# Patient Record
Sex: Male | Born: 1985 | Race: White | Hispanic: No | Marital: Married | Smoking: Never smoker
Health system: Southern US, Community
[De-identification: ages and names within clinical notes are randomized; demographics above are authoritative.]

## PROBLEM LIST (undated history)

## (undated) DIAGNOSIS — T7840XA Allergy, unspecified, initial encounter: Secondary | ICD-10-CM

## (undated) DIAGNOSIS — K219 Gastro-esophageal reflux disease without esophagitis: Secondary | ICD-10-CM

## (undated) DIAGNOSIS — F329 Major depressive disorder, single episode, unspecified: Secondary | ICD-10-CM

## (undated) DIAGNOSIS — F419 Anxiety disorder, unspecified: Secondary | ICD-10-CM

## (undated) DIAGNOSIS — F32A Depression, unspecified: Secondary | ICD-10-CM

## (undated) DIAGNOSIS — Z91018 Allergy to other foods: Secondary | ICD-10-CM

## (undated) DIAGNOSIS — I10 Essential (primary) hypertension: Secondary | ICD-10-CM

## (undated) HISTORY — DX: Allergy, unspecified, initial encounter: T78.40XA

## (undated) HISTORY — DX: Depression, unspecified: F32.A

## (undated) HISTORY — DX: Gastro-esophageal reflux disease without esophagitis: K21.9

## (undated) HISTORY — PX: WISDOM TOOTH EXTRACTION: SHX21

## (undated) HISTORY — PX: NO PAST SURGERIES: SHX2092

## (undated) HISTORY — DX: Anxiety disorder, unspecified: F41.9

## (undated) HISTORY — DX: Allergy to other foods: Z91.018

---
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
## 2006-02-28 ENCOUNTER — Encounter: Admission: RE | Admit: 2006-02-28 | Discharge: 2006-02-28 | Payer: Self-pay | Admitting: Occupational Medicine

## 2015-04-10 ENCOUNTER — Emergency Department (HOSPITAL_BASED_OUTPATIENT_CLINIC_OR_DEPARTMENT_OTHER)
Admission: EM | Admit: 2015-04-10 | Discharge: 2015-04-10 | Disposition: A | Discharge status: Home / Self Care | Payer: Commercial Managed Care - PPO | Attending: Emergency Medicine | Admitting: Emergency Medicine

## 2015-04-10 ENCOUNTER — Encounter (HOSPITAL_BASED_OUTPATIENT_CLINIC_OR_DEPARTMENT_OTHER): Payer: Self-pay | Admitting: *Deleted

## 2015-04-10 DIAGNOSIS — F419 Anxiety disorder, unspecified: Secondary | ICD-10-CM | POA: Insufficient documentation

## 2015-04-10 DIAGNOSIS — R200 Anesthesia of skin: Secondary | ICD-10-CM | POA: Diagnosis not present

## 2015-04-10 DIAGNOSIS — R42 Dizziness and giddiness: Secondary | ICD-10-CM | POA: Diagnosis present

## 2015-04-10 LAB — COMPREHENSIVE METABOLIC PANEL
ALBUMIN: 4.7 g/dL (ref 3.5–5.0)
ALT: 20 U/L (ref 17–63)
ANION GAP: 9 (ref 5–15)
AST: 23 U/L (ref 15–41)
Alkaline Phosphatase: 73 U/L (ref 38–126)
BILIRUBIN TOTAL: 0.8 mg/dL (ref 0.3–1.2)
BUN: 21 mg/dL — ABNORMAL HIGH (ref 6–20)
CO2: 27 mmol/L (ref 22–32)
Calcium: 8.9 mg/dL (ref 8.9–10.3)
Chloride: 102 mmol/L (ref 101–111)
Creatinine, Ser: 1.13 mg/dL (ref 0.61–1.24)
GFR calc Af Amer: >60 mL/min (ref >60)
GFR calc non Af Amer: >60 mL/min (ref >60)
GLUCOSE: 114 mg/dL — AB (ref 65–99)
POTASSIUM: 4 mmol/L (ref 3.5–5.1)
SODIUM: 138 mmol/L (ref 135–145)
TOTAL PROTEIN: 8 g/dL (ref 6.5–8.1)

## 2015-04-10 LAB — CBC WITH DIFFERENTIAL/PLATELET
BASOS PCT: 0 %
Basophils Absolute: 0 10*3/uL (ref 0.0–0.1)
EOS ABS: 0.2 10*3/uL (ref 0.0–0.7)
Eosinophils Relative: 3 %
HCT: 49.6 % (ref 39.0–52.0)
Hemoglobin: 16.8 g/dL (ref 13.0–17.0)
LYMPHS ABS: 2.1 10*3/uL (ref 0.7–4.0)
Lymphocytes Relative: 30 %
MCH: 30.3 pg (ref 26.0–34.0)
MCHC: 33.9 g/dL (ref 30.0–36.0)
MCV: 89.4 fL (ref 78.0–100.0)
MONO ABS: 0.6 10*3/uL (ref 0.1–1.0)
MONOS PCT: 9 %
NEUTROS PCT: 58 %
Neutro Abs: 4 10*3/uL (ref 1.7–7.7)
Platelets: 217 10*3/uL (ref 150–400)
RBC: 5.55 MIL/uL (ref 4.22–5.81)
RDW: 12.3 % (ref 11.5–15.5)
WBC: 7 10*3/uL (ref 4.0–10.5)

## 2015-04-10 MED ORDER — SODIUM CHLORIDE 0.9 % IV BOLUS (SEPSIS)
1000.0000 mL | Freq: Once | INTRAVENOUS | Status: AC
  Administered 2015-04-10: 1000 mL via INTRAVENOUS

## 2015-04-10 NOTE — ED Notes (Signed)
Pt c/o high bp and dizziness onset yesterday  Pt appears to be very anxious

## 2015-04-10 NOTE — Discharge Instructions (Signed)

## 2015-04-10 NOTE — ED Notes (Addendum)
Dizziness last night and at work tonight. Right hand weakness since last night. Hypertension.

## 2015-04-10 NOTE — ED Provider Notes (Signed)
CSN: [id number]     Arrival date & time 04/10/15  2015 History  By signing my name below, I, Lawrence Andrews, attest that this documentation has been prepared under the direction and in the presence of Rolan Bucco, MD. Electronically Signed: Angelene Giovanni, ED Scribe. 04/10/2015. 9:15 PM.    Chief Complaint  Patient presents with  . Hypertension   The history is provided by the patient. No language interpreter was used.   HPI Comments: Lawrence Andrews is a 30 y.o. male who presents to the Emergency Department complaining of gradually worsening intermittent episodes of lightheadedness where he "feels as though he he would pass out" onset yesterday. He reports intermittent numbness and a "cold sensation" in right last 2 digits of his hand and pain that radiates down the back of right leg occassionally. He denies any spinning sensation. He states that he took aspirin last night and 3 baby aspirins today without improvement. He states that normally if he has HTN, it is attributed to anxiety and he is currently under stress. He adds that he has been drinking a lot of ETOH lately. He reports a family hx of stroke with his grandmother. He denies any LOC, CP, chest tightness, SOB, abdominal pain, n/v, sinus pressure, weakness, gait problems, trouble with balance, or speech difficulty.   No PCP.    History reviewed. No pertinent past medical history. History reviewed. No pertinent past surgical history. No family history on file. Social History  Substance Use Topics  . Smoking status: Never Smoker   . Smokeless tobacco: None  . Alcohol Use: Yes     Comment: daily    Review of Systems  Constitutional: Negative for fever, chills, diaphoresis and fatigue.  HENT: Negative for congestion, rhinorrhea and sneezing.   Eyes: Negative.   Respiratory: Negative for cough, chest tightness and shortness of breath.   Cardiovascular: Negative for chest pain and leg swelling.  Gastrointestinal: Negative  for nausea, vomiting, abdominal pain, diarrhea and blood in stool.  Genitourinary: Negative for frequency, hematuria, flank pain and difficulty urinating.  Musculoskeletal: Negative for back pain and arthralgias.  Skin: Negative for rash.  Neurological: Positive for dizziness, light-headedness and numbness. Negative for speech difficulty, weakness and headaches.  Psychiatric/Behavioral: The patient is nervous/anxious.       Allergies  Review of patient's allergies indicates no known allergies.  Home Medications   Prior to Admission medications   Not on File   BP 129/72 mmHg  Pulse 90  Temp(Src) 98 F (36.7 C) (Oral)  Resp 13  Ht  (1.88 m)  Wt 240 lb (108.863 kg)  BMI 30.80 kg/m2  SpO2 98% Physical Exam  Constitutional: He is oriented to person, place, and time. He appears well-developed and well-nourished.  HENT:  Head: Normocephalic and atraumatic.  Eyes: Pupils are equal, round, and reactive to light.  Neck: Normal range of motion. Neck supple.  Cardiovascular: Normal rate, regular rhythm and normal heart sounds.   Pulmonary/Chest: Effort normal and breath sounds normal. No respiratory distress. He has no wheezes. He has no rales. He exhibits no tenderness.  Abdominal: Soft. Bowel sounds are normal. There is no tenderness. There is no rebound and no guarding.  Musculoskeletal: Normal range of motion. He exhibits no edema.  Lymphadenopathy:    He has no cervical adenopathy.  Neurological: He is alert and oriented to person, place, and time.  Motor 5 out of 5 all extremities, sensation grossly intact to light touch all extremities, no pronator drift, finger nose  intact, cranial nerves II through XII grossly intact  Skin: Skin is warm and dry. No rash noted.  Psychiatric: He has a normal mood and affect.    ED Course  Procedures (including critical care time) DIAGNOSTIC STUDIES: Oxygen Saturation is 98% on RA, normal by my interpretation.    COORDINATION OF  CARE: 9:05 PM- Pt advised of plan for treatment and pt agrees. Pt will receive lab work for further evaluation. Explained low suspicion for a stroke and advised pt to establish care with a PCP to maintain adequate blood pressure and cholesterol levels.    Labs Review Results for orders placed or performed during the hospital encounter of 04/10/15  CBC with Differential  Result Value Ref Range   WBC 7.0 4.0 - 10.5 K/uL   RBC 5.55 4.22 - 5.81 MIL/uL   Hemoglobin 16.8 13.0 - 17.0 g/dL   HCT 45.4 09.8 - 11.9 %   MCV 89.4 78.0 - 100.0 fL   MCH 30.3 26.0 - 34.0 pg   MCHC 33.9 30.0 - 36.0 g/dL   RDW 14.7 82.9 - 56.2 %   Platelets 217 150 - 400 K/uL   Neutrophils Relative % 58 %   Neutro Abs 4.0 1.7 - 7.7 K/uL   Lymphocytes Relative 30 %   Lymphs Abs 2.1 0.7 - 4.0 K/uL   Monocytes Relative 9 %   Monocytes Absolute 0.6 0.1 - 1.0 K/uL   Eosinophils Relative 3 %   Eosinophils Absolute 0.2 0.0 - 0.7 K/uL   Basophils Relative 0 %   Basophils Absolute 0.0 0.0 - 0.1 K/uL  Comprehensive metabolic panel  Result Value Ref Range   Sodium 138 135 - 145 mmol/L   Potassium 4.0 3.5 - 5.1 mmol/L   Chloride 102 101 - 111 mmol/L   CO2 27 22 - 32 mmol/L   Glucose, Bld 114 (H) 65 - 99 mg/dL   BUN 21 (H) 6 - 20 mg/dL   Creatinine, Ser 1.30 0.61 - 1.24 mg/dL   Calcium 8.9 8.9 - 86.5 mg/dL   Total Protein 8.0 6.5 - 8.1 g/dL   Albumin 4.7 3.5 - 5.0 g/dL   AST 23 15 - 41 U/L   ALT 20 17 - 63 U/L   Alkaline Phosphatase 73 38 - 126 U/L   Total Bilirubin 0.8 0.3 - 1.2 mg/dL   GFR calc non Af Amer >60 >60 mL/min   GFR calc Af Amer >60 >60 mL/min   Anion gap 9 5 - 15   No results found.  ED ECG REPORT   Date: 04/10/2015  Rate: 81  Rhythm: normal sinus rhythm  QRS Axis: normal  Intervals: normal  ST/T Wave abnormalities: normal  Conduction Disutrbances:none  Narrative Interpretation:   Old EKG Reviewed: none available  I have personally reviewed the EKG tracing and agree with the computerized  printout as noted.   Rolan Bucco, MD has personally reviewed and evaluated these images and lab results as part of her medical decision-making.  MDM   Final diagnoses:  Dizziness    Patient presents with lightheadedness. He has no associated symptoms that we more consistent with acute coronary syndrome. He has no neurologic deficits. He does report some occasional numbness to the fourth and fifth digits of the right hand. He has no associated weakness in the extremities, although this was reported on the triage notes. He has symmetric strength in all extremities. He has a normal neurologic exam currently. His blood pressure was elevated on arrival but has  normalized. His labs are unremarkable. He's able to ambulate without symptoms. He was discharged home in good condition. He was advised he needs to follow-up with a primary care provider.  He does advise that he has insurance and doesn't think that it will be a problem to obtain a PCP. Return precautions were given.  I personally performed the services described in this documentation, which was scribed in my presence.  The recorded information has been reviewed and considered.    Rolan Bucco, MD 04/10/15 2223

## 2016-05-20 DIAGNOSIS — I10 Essential (primary) hypertension: Secondary | ICD-10-CM | POA: Insufficient documentation

## 2016-05-20 DIAGNOSIS — F411 Generalized anxiety disorder: Secondary | ICD-10-CM | POA: Insufficient documentation

## 2016-05-20 DIAGNOSIS — F410 Panic disorder [episodic paroxysmal anxiety] without agoraphobia: Secondary | ICD-10-CM | POA: Insufficient documentation

## 2016-08-10 DIAGNOSIS — F331 Major depressive disorder, recurrent, moderate: Secondary | ICD-10-CM | POA: Insufficient documentation

## 2017-02-24 DIAGNOSIS — F17220 Nicotine dependence, chewing tobacco, uncomplicated: Secondary | ICD-10-CM | POA: Insufficient documentation

## 2017-02-24 DIAGNOSIS — K219 Gastro-esophageal reflux disease without esophagitis: Secondary | ICD-10-CM | POA: Insufficient documentation

## 2017-07-05 DIAGNOSIS — E559 Vitamin D deficiency, unspecified: Secondary | ICD-10-CM | POA: Insufficient documentation

## 2017-07-05 DIAGNOSIS — B360 Pityriasis versicolor: Secondary | ICD-10-CM | POA: Insufficient documentation

## 2017-07-07 DIAGNOSIS — E291 Testicular hypofunction: Secondary | ICD-10-CM | POA: Insufficient documentation

## 2018-01-23 ENCOUNTER — Encounter (HOSPITAL_BASED_OUTPATIENT_CLINIC_OR_DEPARTMENT_OTHER): Payer: Self-pay | Admitting: Emergency Medicine

## 2018-01-23 ENCOUNTER — Emergency Department (HOSPITAL_BASED_OUTPATIENT_CLINIC_OR_DEPARTMENT_OTHER)
Admission: EM | Admit: 2018-01-23 | Discharge: 2018-01-23 | Disposition: A | Discharge status: Home / Self Care | Payer: Commercial Managed Care - PPO | Attending: Emergency Medicine | Admitting: Emergency Medicine

## 2018-01-23 ENCOUNTER — Emergency Department (HOSPITAL_BASED_OUTPATIENT_CLINIC_OR_DEPARTMENT_OTHER): Payer: Commercial Managed Care - PPO

## 2018-01-23 ENCOUNTER — Other Ambulatory Visit: Payer: Self-pay

## 2018-01-23 DIAGNOSIS — F419 Anxiety disorder, unspecified: Secondary | ICD-10-CM | POA: Insufficient documentation

## 2018-01-23 DIAGNOSIS — R0789 Other chest pain: Secondary | ICD-10-CM | POA: Insufficient documentation

## 2018-01-23 DIAGNOSIS — I10 Essential (primary) hypertension: Secondary | ICD-10-CM | POA: Insufficient documentation

## 2018-01-23 DIAGNOSIS — R42 Dizziness and giddiness: Secondary | ICD-10-CM | POA: Insufficient documentation

## 2018-01-23 HISTORY — DX: Essential (primary) hypertension: I10

## 2018-01-23 LAB — CBC WITH DIFFERENTIAL/PLATELET
Abs Immature Granulocytes: 0.02 10*3/uL (ref 0.00–0.07)
BASOS ABS: 0.1 10*3/uL (ref 0.0–0.1)
Basophils Relative: 1 %
EOS ABS: 0.2 10*3/uL (ref 0.0–0.5)
EOS PCT: 2 %
HCT: 47.2 % (ref 39.0–52.0)
HEMOGLOBIN: 15.7 g/dL (ref 13.0–17.0)
Immature Granulocytes: 0 %
LYMPHS ABS: 2 10*3/uL (ref 0.7–4.0)
LYMPHS PCT: 25 %
MCH: 30.3 pg (ref 26.0–34.0)
MCHC: 33.3 g/dL (ref 30.0–36.0)
MCV: 91.1 fL (ref 80.0–100.0)
MONOS PCT: 8 %
Monocytes Absolute: 0.7 10*3/uL (ref 0.1–1.0)
Neutro Abs: 5 10*3/uL (ref 1.7–7.7)
Neutrophils Relative %: 64 %
Platelets: 221 10*3/uL (ref 150–400)
RBC: 5.18 MIL/uL (ref 4.22–5.81)
RDW: 11.4 % — AB (ref 11.5–15.5)
WBC: 7.9 10*3/uL (ref 4.0–10.5)
nRBC: 0 % (ref 0.0–0.2)

## 2018-01-23 LAB — BASIC METABOLIC PANEL
ANION GAP: 11 (ref 5–15)
BUN: 12 mg/dL (ref 6–20)
CALCIUM: 8.9 mg/dL (ref 8.9–10.3)
CHLORIDE: 103 mmol/L (ref 98–111)
CO2: 23 mmol/L (ref 22–32)
Creatinine, Ser: 0.84 mg/dL (ref 0.61–1.24)
GLUCOSE: 104 mg/dL — AB (ref 70–99)
Potassium: 3.9 mmol/L (ref 3.5–5.1)
Sodium: 137 mmol/L (ref 135–145)

## 2018-01-23 LAB — TROPONIN I: Troponin I: <0.03 ng/mL (ref <0.03)

## 2018-01-23 MED ORDER — LORAZEPAM 1 MG PO TABS
1.0000 mg | ORAL_TABLET | Freq: Once | ORAL | Status: AC
  Administered 2018-01-23: 1 mg via ORAL
  Filled 2018-01-23: qty 1

## 2018-01-23 NOTE — ED Triage Notes (Addendum)
Patient presents via ems with complaints of not feeling right, states stopped his bp meds several months ago; co nausea, co lightheadedness and dizziness; ambulatory without difficulty. Patient reports drinking 6 beers tonight.

## 2018-01-23 NOTE — ED Notes (Signed)
Patient transported to X-ray 

## 2018-01-23 NOTE — ED Provider Notes (Signed)
TIME SEEN: 2:43 AM  CHIEF COMPLAINT: "I am not feeling right"  HPI: Patient is a 32 year old male with history of hypertension and anxiety who presents to the emergency department complaining that he is not feeling well for the past 2 days.  States he has had intermittent headaches, left-sided chest tightness, lightheadedness and an episode of right arm numbness that occurred yesterday.  States that he feels that this may be secondary to "my nerves".  States he stopped taking Effexor and clonazepam about 3 months ago.  States tonight he could not sleep and felt very restless and felt like her heart rate was fast.  He has had some nausea but no shortness of breath.  No diaphoresis.  No current numbness, tingling or focal weakness.  Episode of right arm numbness occurred yesterday while at work and lasted for about 5 minutes.  Has an appointment to see his primary care doctor tomorrow at 12:15 PM.  ROS: See HPI Constitutional: no fever  Eyes: no drainage  ENT: no runny nose   Cardiovascular:   chest pain  Resp: no SOB  GI: no vomiting GU: no dysuria Integumentary: no rash  Allergy: no hives  Musculoskeletal: no leg swelling  Neurological: no slurred speech ROS otherwise negative  PAST MEDICAL HISTORY/PAST SURGICAL HISTORY:  Past Medical History:  Diagnosis Date  . Hypertension     MEDICATIONS:  Prior to Admission medications   Not on File    ALLERGIES:  No Known Allergies  SOCIAL HISTORY:  Social History   Tobacco Use  . Smoking status: Never Smoker  . Smokeless tobacco: Current User  Substance Use Topics  . Alcohol use: Yes    Comment: daily    FAMILY HISTORY: No family history on file.  EXAM: BP 132/90 (BP Location: Left Arm)   Pulse 88   Temp 98.7 F (37.1 C) (Oral)   Resp 18   Ht 6\' 2"  (1.88 m)   Wt 113.4 kg   SpO2 97%   BMI 32.10 kg/m  CONSTITUTIONAL: Alert and oriented and responds appropriately to questions.  Appears anxious. HEAD: Normocephalic EYES:  Conjunctivae clear, pupils appear equal, EOMI ENT: normal nose; moist mucous membranes NECK: Supple, no meningismus, no nuchal rigidity, no LAD  CARD: RRR; S1 and S2 appreciated; no murmurs, no clicks, no rubs, no gallops RESP: Normal chest excursion without splinting or tachypnea; breath sounds clear and equal bilaterally; no wheezes, no rhonchi, no rales, no hypoxia or respiratory distress, speaking full sentences ABD/GI: Normal bowel sounds; non-distended; soft, non-tender, no rebound, no guarding, no peritoneal signs, no hepatosplenomegaly BACK:  The back appears normal and is non-tender to palpation, there is no CVA tenderness EXT: Normal ROM in all joints; non-tender to palpation; no edema; normal capillary refill; no cyanosis, no calf tenderness or swelling    SKIN: Normal color for age and race; warm; no rash NEURO: Moves all extremities equally, strength 5/5 in all 4 extremities, sensation to light touch intact diffusely, cranial nerves II through XII intact, normal speech PSYCH: Appears anxious.  Patient denies SI or HI.  MEDICAL DECISION MAKING: Patient here with lightheadedness, chest pain, nausea, right arm numbness, restlessness, palpitations.  I agree that this is likely related to his anxiety.  Will check basic labs, troponin, chest x-ray to ensure there is no anemia, electrolyte abnormalities, ACS causing his symptoms.  Doubt stroke, intracranial hemorrhage, meningitis, mass.  I do not feel he needs brain imaging at this time given he is neurologically intact without headache.  He initially thought this could be related to his blood pressure as he has been off medications for several months but his blood pressure is currently 130/90s.  States he checked it yesterday and it was 150/110s.  Will give oral Ativan for symptomatic relief.  He came here with EMS and states that a friend can come pick him up.  ED PROGRESS: She reports feeling better after oral Ativan.  Labs here are  unremarkable.  Negative troponin and clear chest x-ray.  I suspect symptoms are related to anxiety.  He has no current psychiatric safety concern and I do not feel needs emergent TTS evaluation.  He has an appointment to see his primary care physician tomorrow.  Have recommended discussion about restarting his clonazepam and Effexor.  Patient agrees.  I do not feel this time he needs further emergent work-up.  Low suspicion for ACS given negative troponin today.   At this time, I do not feel there is any life-threatening condition present. I have reviewed and discussed all results (EKG, imaging, lab, urine as appropriate) and exam findings with patient/family. I have reviewed nursing notes and appropriate previous records.  I feel the patient is safe to be discharged home without further emergent workup and can continue workup as an outpatient as needed. Discussed usual and customary return precautions. Patient/family verbalize understanding and are comfortable with this plan.  Outpatient follow-up has been provided if needed. All questions have been answered.      Date: 01/23/2018 3:26 AM  Rate: 80  Rhythm: normal sinus rhythm  QRS Axis: normal  Intervals: normal  ST/T Wave abnormalities: normal  Conduction Disutrbances: none  Narrative Interpretation: unremarkable; no change compared to previous EKG       Suda Forbess, Layla Maw, DO 01/23/18 [phone]

## 2018-07-16 ENCOUNTER — Emergency Department
Admission: EM | Admit: 2018-07-16 | Discharge: 2018-07-16 | Disposition: A | Discharge status: Home / Self Care | Payer: No Typology Code available for payment source | Attending: Emergency Medicine | Admitting: Emergency Medicine

## 2018-07-16 ENCOUNTER — Other Ambulatory Visit: Payer: Self-pay

## 2018-07-16 ENCOUNTER — Emergency Department: Payer: No Typology Code available for payment source

## 2018-07-16 DIAGNOSIS — F419 Anxiety disorder, unspecified: Secondary | ICD-10-CM | POA: Diagnosis not present

## 2018-07-16 DIAGNOSIS — R0789 Other chest pain: Secondary | ICD-10-CM | POA: Insufficient documentation

## 2018-07-16 DIAGNOSIS — R202 Paresthesia of skin: Secondary | ICD-10-CM | POA: Insufficient documentation

## 2018-07-16 DIAGNOSIS — F17220 Nicotine dependence, chewing tobacco, uncomplicated: Secondary | ICD-10-CM | POA: Diagnosis not present

## 2018-07-16 DIAGNOSIS — R002 Palpitations: Secondary | ICD-10-CM | POA: Insufficient documentation

## 2018-07-16 DIAGNOSIS — I10 Essential (primary) hypertension: Secondary | ICD-10-CM | POA: Diagnosis not present

## 2018-07-16 LAB — CBC
HCT: 47.7 % (ref 39.0–52.0)
Hemoglobin: 16.2 g/dL (ref 13.0–17.0)
MCH: 31 pg (ref 26.0–34.0)
MCHC: 34 g/dL (ref 30.0–36.0)
MCV: 91.2 fL (ref 80.0–100.0)
Platelets: 191 10*3/uL (ref 150–400)
RBC: 5.23 MIL/uL (ref 4.22–5.81)
RDW: 12.4 % (ref 11.5–15.5)
WBC: 8.7 10*3/uL (ref 4.0–10.5)
nRBC: 0 % (ref 0.0–0.2)

## 2018-07-16 LAB — BASIC METABOLIC PANEL
Anion gap: 11 (ref 5–15)
BUN: 12 mg/dL (ref 6–20)
CO2: 24 mmol/L (ref 22–32)
Calcium: 8.9 mg/dL (ref 8.9–10.3)
Chloride: 102 mmol/L (ref 98–111)
Creatinine, Ser: 0.86 mg/dL (ref 0.61–1.24)
GFR calc Af Amer: >60 mL/min (ref >60)
GFR calc non Af Amer: >60 mL/min (ref >60)
Glucose, Bld: 96 mg/dL (ref 70–99)
Potassium: 3.8 mmol/L (ref 3.5–5.1)
Sodium: 137 mmol/L (ref 135–145)

## 2018-07-16 LAB — T4, FREE: Free T4: 0.91 ng/dL (ref 0.82–1.77)

## 2018-07-16 LAB — TROPONIN I: Troponin I: <0.03 ng/mL (ref <0.03)

## 2018-07-16 LAB — TSH: TSH: 0.78 u[IU]/mL (ref 0.350–4.500)

## 2018-07-16 MED ORDER — DIAZEPAM 5 MG PO TABS
5.0000 mg | ORAL_TABLET | Freq: Once | ORAL | Status: AC
  Administered 2018-07-16: 14:00:00 5 mg via ORAL
  Filled 2018-07-16: qty 1

## 2018-07-16 NOTE — ED Provider Notes (Signed)
-----------------------------------------   3:56 PM on 07/16/2018 -----------------------------------------  Patient care assumed from Dr. Mayford Knife.  Patient's work-up is largely nonrevealing.  Reassuringly negative troponin.  Normal labs, negative chest x-ray.  Normal-appearing EKG besides slight tachycardia on my review.  We will discharge the patient home with PCP follow-up.  Provided my normal chest pain return precautions.  Patient agreeable to plan of care.   Minna Antis, MD 07/16/18 [phone]

## 2018-07-16 NOTE — ED Provider Notes (Signed)
[facility] Emergency Department Provider Note       Time seen: ----------------------------------------- 1:52 PM on 07/16/2018 -----------------------------------------   I have reviewed the triage vital signs and the nursing notes.  HISTORY   Chief Complaint Chest Pain    HPI Lawrence Andrews is a 33 y.o. male with a history of hypertension who presents to the ED for right-sided chest pain as well as right arm paresthesias.  Patient states his been having palpitations where his feel like his heart is skipping beats but today started having pain and numbness.  He denies any recent illness or other complaints.  Past Medical History:  Diagnosis Date  . Hypertension     There are no active problems to display for this patient.   History reviewed. No pertinent surgical history.  Allergies Patient has no known allergies.  Social History Social History   Tobacco Use  . Smoking status: Never Smoker  . Smokeless tobacco: Current User  Substance Use Topics  . Alcohol use: Yes    Comment: daily  . Drug use: No   Review of Systems Constitutional: Negative for fever. Cardiovascular: Positive for chest pain Respiratory: Negative for shortness of breath. Gastrointestinal: Negative for abdominal pain, vomiting and diarrhea. Musculoskeletal: Positive for right arm pain Skin: Negative for rash. Neurological: Negative for headaches, positive for paresthesias  All systems negative/normal/unremarkable except as stated in the HPI  ____________________________________________   PHYSICAL EXAM:  VITAL SIGNS: ED Triage Vitals  Enc Vitals Group     BP 07/16/18 1343 (!) 138/91     Pulse Rate 07/16/18 1343 92     Resp 07/16/18 1343 17     Temp 07/16/18 1343 98.9 F (37.2 C)     Temp Source 07/16/18 1343 Oral     SpO2 07/16/18 1343 98 %     Weight 07/16/18 1334 250 lb (113.4 kg)     Height 07/16/18 1334 6\' 2"  (1.88 m)     Head Circumference --    Peak Flow --      Pain Score 07/16/18 1334 0     Pain Loc --      Pain Edu? --      Excl. in GC? --    Constitutional: Alert and oriented.  Anxious, no distress Eyes: Conjunctivae are normal. Normal extraocular movements. ENT      Head: Normocephalic and atraumatic.      Nose: No congestion/rhinnorhea.      Mouth/Throat: Mucous membranes are moist.      Neck: No stridor. Cardiovascular: Normal rate, regular rhythm. No murmurs, rubs, or gallops. Respiratory: Normal respiratory effort without tachypnea nor retractions. Breath sounds are clear and equal bilaterally. No wheezes/rales/rhonchi. Gastrointestinal: Soft and nontender. Normal bowel sounds Musculoskeletal: Nontender with normal range of motion in extremities. No lower extremity tenderness nor edema. Neurologic:  Normal speech and language. No gross focal neurologic deficits are appreciated.  Skin:  Skin is warm, dry and intact. No rash noted. Psychiatric: Mood and affect are normal. Speech and behavior are normal.  ____________________________________________  EKG: Interpreted by me.  Sinus tachycardia with a rate of 101 bpm, normal PR interval, normal QRS, normal QT  ____________________________________________  ED COURSE:  As part of my medical decision making, I reviewed the following data within the electronic MEDICAL RECORD NUMBER History obtained from family if available, nursing notes, old chart and ekg, as well as notes from prior ED visits. Patient presented for right-sided chest pain and paresthesias, we will assess with labs and  imaging as indicated at this time.   Procedures  Lawrence Andrews was evaluated in Emergency Department on 07/16/2018 for the symptoms described in the history of present illness. He was evaluated in the context of the global COVID-19 pandemic, which necessitated consideration that the patient might be at risk for infection with the SARS-CoV-2 virus that causes COVID-19. Institutional protocols and  algorithms that pertain to the evaluation of patients at risk for COVID-19 are in a state of rapid change based on information released by regulatory bodies including the CDC and federal and state organizations. These policies and algorithms were followed during the patient's care in the ED.  ____________________________________________   LABS (pertinent positives/negatives)  Labs Reviewed  CBC  BASIC METABOLIC PANEL  TROPONIN I  TSH  T4, FREE    RADIOLOGY Images were viewed by me  chest x-ray IMPRESSION: Normal exam. ____________________________________________   DIFFERENTIAL DIAGNOSIS   Musculoskeletal pain, anxiety, GERD, unstable angina, hyperthyroidism, arrhythmia  FINAL ASSESSMENT AND PLAN  Chest pain, palpitations   Plan: The patient had presented for chest pain with palpitations and paresthesias. Patient's labs are still pending but otherwise have seemed unremarkable to this point. Patient's imaging is reassuring.  Symptoms are most likely consistent with anxiety or noncardiac chest pain.   Ulice DashJohnathan E Ver[place], MD    Note: This note was generated in part or whole with voice recognition software. Voice recognition is usually quite accurate but there are transcription errors that can and very often do occur. I apologize for any typographical errors that were not detected and corrected.     Emily FilbertWilliams, Delando Satter E, MD 07/16/18 1450

## 2018-07-16 NOTE — Discharge Instructions (Addendum)
Please call the number provided for cardiology to arrange a follow-up appointment.  Return to the emergency department for any further chest pain, shortness of breath, or any other symptom personally concerning to yourself.

## 2018-07-16 NOTE — ED Triage Notes (Signed)
Pt c/o chest pain with right arm numbness, pt states he has been having palpitations for a while now but today having the pain/numbness today.

## 2018-07-23 DIAGNOSIS — R0683 Snoring: Secondary | ICD-10-CM | POA: Insufficient documentation

## 2018-07-23 DIAGNOSIS — R7989 Other specified abnormal findings of blood chemistry: Secondary | ICD-10-CM | POA: Insufficient documentation

## 2018-10-23 ENCOUNTER — Ambulatory Visit: Payer: Self-pay | Admitting: Internal Medicine

## 2018-10-23 DIAGNOSIS — Z0289 Encounter for other administrative examinations: Secondary | ICD-10-CM

## 2019-02-16 ENCOUNTER — Encounter: Payer: Self-pay | Admitting: Emergency Medicine

## 2019-02-16 ENCOUNTER — Emergency Department
Admission: EM | Admit: 2019-02-16 | Discharge: 2019-02-16 | Disposition: A | Discharge status: Home / Self Care | Payer: No Typology Code available for payment source | Attending: Emergency Medicine | Admitting: Emergency Medicine

## 2019-02-16 ENCOUNTER — Emergency Department: Payer: No Typology Code available for payment source

## 2019-02-16 ENCOUNTER — Other Ambulatory Visit: Payer: Self-pay

## 2019-02-16 DIAGNOSIS — G43909 Migraine, unspecified, not intractable, without status migrainosus: Secondary | ICD-10-CM | POA: Insufficient documentation

## 2019-02-16 DIAGNOSIS — I10 Essential (primary) hypertension: Secondary | ICD-10-CM | POA: Insufficient documentation

## 2019-02-16 DIAGNOSIS — R519 Headache, unspecified: Secondary | ICD-10-CM

## 2019-02-16 DIAGNOSIS — M542 Cervicalgia: Secondary | ICD-10-CM | POA: Diagnosis present

## 2019-02-16 LAB — CBC
HCT: 48.4 % (ref 39.0–52.0)
Hemoglobin: 16 g/dL (ref 13.0–17.0)
MCH: 30.9 pg (ref 26.0–34.0)
MCHC: 33.1 g/dL (ref 30.0–36.0)
MCV: 93.4 fL (ref 80.0–100.0)
Platelets: 200 K/uL (ref 150–400)
RBC: 5.18 MIL/uL (ref 4.22–5.81)
RDW: 11.5 % (ref 11.5–15.5)
WBC: 5.9 K/uL (ref 4.0–10.5)
nRBC: 0 % (ref 0.0–0.2)

## 2019-02-16 LAB — COMPREHENSIVE METABOLIC PANEL WITH GFR
ALT: 19 U/L (ref 0–44)
AST: 20 U/L (ref 15–41)
Albumin: 4.6 g/dL (ref 3.5–5.0)
Alkaline Phosphatase: 52 U/L (ref 38–126)
Anion gap: 8 (ref 5–15)
BUN: 23 mg/dL — ABNORMAL HIGH (ref 6–20)
CO2: 28 mmol/L (ref 22–32)
Calcium: 9.6 mg/dL (ref 8.9–10.3)
Chloride: 102 mmol/L (ref 98–111)
Creatinine, Ser: 1.08 mg/dL (ref 0.61–1.24)
GFR calc Af Amer: >60 mL/min (ref >60)
GFR calc non Af Amer: >60 mL/min (ref >60)
Glucose, Bld: 112 mg/dL — ABNORMAL HIGH (ref 70–99)
Potassium: 4.4 mmol/L (ref 3.5–5.1)
Sodium: 138 mmol/L (ref 135–145)
Total Bilirubin: 0.6 mg/dL (ref 0.3–1.2)
Total Protein: 7.4 g/dL (ref 6.5–8.1)

## 2019-02-16 LAB — TROPONIN I (HIGH SENSITIVITY): Troponin I (High Sensitivity): <2 ng/L (ref <18)

## 2019-02-16 MED ORDER — CHLORDIAZEPOXIDE HCL 25 MG PO CAPS: 25.0000 mg | ORAL_CAPSULE | Freq: Four times a day (QID) | ORAL | 0 refills | Status: DC | PRN

## 2019-02-16 NOTE — ED Provider Notes (Addendum)
[facility] Department Provider Note       Time seen: ----------------------------------------- 11:40 AM on 02/16/2019 -----------------------------------------   I have reviewed the triage vital signs and the nursing notes.  HISTORY   Chief Complaint Jaw Pain    HPI Lawrence Andrews is a 33 y.o. male with a history of hypertension who presents to the ED for neck pain, headache, jaw pressure intermittently since earlier this week is now constant.  He denies any chest pain or cough, denies shortness of breath.  He stopped drinking alcohol 3 days ago.  Pain is 5 out of 10 in his neck.  Past Medical History:  Diagnosis Date  . Hypertension     There are no active problems to display for this patient.   History reviewed. No pertinent surgical history.  Allergies Patient has no known allergies.  Social History Social History   Tobacco Use  . Smoking status: Never Smoker  . Smokeless tobacco: Current User  Substance Use Topics  . Alcohol use: Yes    Comment: daily  . Drug use: No   Review of Systems Constitutional: Negative for fever. HEENT: Positive for jaw pain Cardiovascular: Negative for chest pain. Respiratory: Negative for shortness of breath. Gastrointestinal: Negative for abdominal pain, vomiting and diarrhea. Musculoskeletal: Positive for neck pain Skin: Negative for rash. Neurological: Positive for headache  All systems negative/normal/unremarkable except as stated in the HPI  ____________________________________________   PHYSICAL EXAM:  VITAL SIGNS: ED Triage Vitals  Enc Vitals Group     BP 02/16/19 1018 122/67     Pulse Rate 02/16/19 1018 62     Resp 02/16/19 1018 18     Temp 02/16/19 1018 99.5 F (37.5 C)     Temp Source 02/16/19 1018 Oral     SpO2 02/16/19 1018 97 %     Weight 02/16/19 1011 250 lb (113.4 kg)     Height 02/16/19 1011 6\' 2"  (1.88 m)     Head Circumference --      Peak Flow --      Pain  Score 02/16/19 1011 5     Pain Loc --      Pain Edu? --      Excl. in GC? --    Constitutional: Alert and oriented. Well appearing and in no distress. Eyes: Conjunctivae are normal. Normal extraocular movements. ENT      Head: Normocephalic and atraumatic.      Nose: No congestion/rhinnorhea.      Mouth/Throat: Mucous membranes are moist.      Neck: No stridor. Cardiovascular: Normal rate, regular rhythm. No murmurs, rubs, or gallops. Respiratory: Normal respiratory effort without tachypnea nor retractions. Breath sounds are clear and equal bilaterally. No wheezes/rales/rhonchi. Gastrointestinal: Soft and nontender. Normal bowel sounds Musculoskeletal: Nontender with normal range of motion in extremities. No lower extremity tenderness nor edema. Neurologic:  Normal speech and language. No gross focal neurologic deficits are appreciated.  Skin:  Skin is warm, dry and intact. No rash noted. Psychiatric: Mood and affect are normal. Speech and behavior are normal.  ____________________________________________  EKG: Interpreted by me.  Sinus bradycardia with rate of 56 bpm, normal axis, normal QT  ____________________________________________  ED COURSE:  As part of my medical decision making, I reviewed the following data within the electronic MEDICAL RECORD NUMBER History obtained from family if available, nursing notes, old chart and ekg, as well as notes from prior ED visits. Patient presented for multiple complaints, we will assess with labs and  imaging as indicated at this time.   Procedures  Emanuele Mcwhirter was evaluated in Emergency Department on 02/16/2019 for the symptoms described in the history of present illness. He was evaluated in the context of the global COVID-19 pandemic, which necessitated consideration that the patient might be at risk for infection with the SARS-CoV-2 virus that causes COVID-19. Institutional protocols and algorithms that pertain to the evaluation of patients  at risk for COVID-19 are in a state of rapid change based on information released by regulatory bodies including the CDC and federal and state organizations. These policies and algorithms were followed during the patient's care in the ED.  ____________________________________________   LABS (pertinent positives/negatives)  Labs Reviewed  COMPREHENSIVE METABOLIC PANEL - Abnormal; Notable for the following components:      Result Value   Glucose, Bld 112 (*)    BUN 23 (*)    All other components within normal limits  CBC  TROPONIN I (HIGH SENSITIVITY)    RADIOLOGY Images were viewed by me Chest x-ray IMPRESSION: Normal exam.  ____________________________________________   DIFFERENTIAL DIAGNOSIS   Musculoskeletal pain, GERD, anxiety, withdrawal, dehydration, MI  FINAL ASSESSMENT AND PLAN  Headache   Plan: The patient had presented for multiple complaints which are likely a sequela of anxiety and alcohol withdrawal. Patient's labs are reassuring. Patient's imaging did not reveal any acute process.  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  I have advised taking anti-inflammatories for this.  I will place him on a Librium taper and have advised increase p.o. fluid intake.   Laurence Aly, MD    Note: This note was generated in part or whole with voice recognition software. Voice recognition is usually quite accurate but there are transcription errors that can and very often do occur. I apologize for any typographical errors that were not detected and corrected.     Earleen Newport, MD 02/16/19 1142    Earleen Newport, MD 02/16/19 [phone]

## 2019-02-16 NOTE — ED Triage Notes (Signed)
Pt has had neck pain, headache and jaw pressure intermittent earlier this week and now constant.  Denies chest pain or cough. No SHOB.  No fevers.  Pt stopped drinking 3 days ago.  No sweating, hallucinations, tremors, etch.  Unlabored.  ambulatory without distress.

## 2019-02-16 NOTE — ED Notes (Signed)
Pt presents to the ED from home c/o of a shooting headache, pressure behind his eyes and jaw tightness. Pt states that he stopped drinking alcohol a couple of days ago and stopped taking his anxiety medication. Pt states the headache has been going on for about a week and the pressure behind his eye states 3 days ago.

## 2019-02-27 DIAGNOSIS — R002 Palpitations: Secondary | ICD-10-CM | POA: Insufficient documentation

## 2019-02-27 DIAGNOSIS — E782 Mixed hyperlipidemia: Secondary | ICD-10-CM | POA: Insufficient documentation

## 2019-03-06 ENCOUNTER — Ambulatory Visit (INDEPENDENT_AMBULATORY_CARE_PROVIDER_SITE_OTHER): Payer: No Typology Code available for payment source | Admitting: Licensed Clinical Social Worker

## 2019-03-06 ENCOUNTER — Other Ambulatory Visit: Payer: Self-pay

## 2019-03-06 DIAGNOSIS — F489 Nonpsychotic mental disorder, unspecified: Secondary | ICD-10-CM

## 2019-03-06 NOTE — Progress Notes (Signed)
Clinician had a virtual therapy appointment with Lawrence Andrews scheduled today at [facility] did not present for virtual [facility] appointment, and did not answer phone call from clinician at 9:00am to remind him of this appointment.  Clinician left a voicemail encouraging Lawrence Andrews to call back in case he forgot about appointment, or needs to reschedule for another day. Clinician included office phone numbers in this message.    [address] [place], [place] 03/06/19

## 2019-04-16 ENCOUNTER — Ambulatory Visit: Payer: No Typology Code available for payment source | Admitting: Allergy and Immunology

## 2019-04-16 ENCOUNTER — Other Ambulatory Visit: Payer: Self-pay

## 2019-04-16 ENCOUNTER — Encounter: Payer: Self-pay | Admitting: Allergy and Immunology

## 2019-04-16 VITALS — BP 130/82 | HR 98 | Temp 96.6°F | Resp 16 | Ht 74.0 in | Wt 255.8 lb

## 2019-04-16 DIAGNOSIS — T783XXA Angioneurotic edema, initial encounter: Secondary | ICD-10-CM | POA: Diagnosis not present

## 2019-04-16 DIAGNOSIS — T7840XA Allergy, unspecified, initial encounter: Secondary | ICD-10-CM | POA: Insufficient documentation

## 2019-04-16 DIAGNOSIS — L500 Allergic urticaria: Secondary | ICD-10-CM | POA: Diagnosis not present

## 2019-04-16 DIAGNOSIS — R040 Epistaxis: Secondary | ICD-10-CM | POA: Diagnosis not present

## 2019-04-16 MED ORDER — EPINEPHRINE 0.3 MG/0.3ML IJ SOAJ: 0.3000 mg | Freq: Once | INTRAMUSCULAR | 1 refills | Status: AC

## 2019-04-16 MED ORDER — FAMOTIDINE 20 MG PO TABS: 20.0000 mg | ORAL_TABLET | Freq: Two times a day (BID) | ORAL | 5 refills | Status: DC

## 2019-04-16 MED ORDER — LEVOCETIRIZINE DIHYDROCHLORIDE 5 MG PO TABS: 5.0000 mg | ORAL_TABLET | Freq: Every evening | ORAL | 5 refills | Status: DC

## 2019-04-16 NOTE — Assessment & Plan Note (Signed)
Associated angioedema occurs in up to 50% of patients with chronic urticaria.  Treatment/diagnostic plan as outlined above. 

## 2019-04-16 NOTE — Progress Notes (Addendum)
New Patient Note  RE: Lawrence Andrews MRN: [id number] DOB: 1986-01-07 Date of Office Visit: 04/16/2019  Referring provider: No ref. provider found Primary care provider: Kirk Ruths, MD  Chief Complaint: Allergic Reaction, Urticaria, and Angioedema   History of present illness: Lawrence Andrews is a 34 y.o. male presenting today for evaluation of allergic reaction.  [name] reports that in August 2020 he had multiple fire ant bites on his lower extremities.  He developed generalized urticaria and had difficulty breathing.  He was evaluated and treated in the emergency department.  He was prescribed epinephrine autoinjectors at that time. Since early November 2020, [name] has experienced recurrent episodes of hives. The hives have appeared at different times over his chest, back, shoulders and torso.  The lesions are described as erythematous, raised, and pruritic.  Individual hives last less than 24 hours without leaving residual pigmentation or bruising. He has experienced associated angioedema of the lips and tongue but denies concomitant cardiopulmonary or GI symptoms.  He reports a tingling sensation of the lip prior to swallowing and a raw feeling of the tongue prior to/during tongue swelling.  He has not experienced unexpected weight loss, recurrent fevers or drenching night sweats. No specific medication, food, skin care product, detergent, soap, or other environmental triggers have been identified. The symptoms do not seem to correlate with NSAIDs use. He reports that at baseline he carries increased emotional stress. He did not have symptoms consistent with a respiratory tract infection at the time of symptom onset. [name] has tried to control symptoms with OTC antihistamines which have offered adequate temporary relief. He has been evaluated and treated in an urgent care facility for these symptoms. Skin biopsy has not been performed. Recently, he has been experiencing hives a few  times a month. He has no history of symptoms consistent with allergic rhinoconjunctivitis, asthma, or atopic dermatitis.  He notes that he does experience recurrent epistaxis.  At times, there is just blood-streaked mucus, at other times it may take up to 5 minutes to stanch the epistaxis.  The bleeding has occurred from the left and/or right nostril.  Assessment and plan: Recurrent urticaria Unclear etiology. Skin tests to select food allergens were negative today.  Environmental skin test revealed reactivity to pollen, however the symptoms have occurred throughout the winter in the absence of pollen exposure.  NSAIDs and emotional stress commonly exacerbate urticaria but are not the underlying etiology in this case. Physical urticarias are negative by history (i.e. pressure-induced, temperature, vibration, solar, etc.). History and lesions are not consistent with urticaria pigmentosa so I am not suspicious for mastocytosis. There are no concomitant symptoms concerning for anaphylaxis or constitutional symptoms worrisome for an underlying malignancy. We will rule out other potential etiologies with labs. For symptom relief, patient is to take oral antihistamines as directed.  The following labs have been ordered: FCeRI antibody, anti-thyroglobulin antibody, thyroid peroxidase antibody, C4 level, tryptase, H. pylori serology, CBC, CMP, ESR, ANA, and alpha-gal panel.  The patient will be called with further recommendations after lab results have returned.  Instructions have been discussed and provided for H1/H2 receptor blockade with titration to find lowest effective dose.  A prescription has been provided for levocetirizine (Xyzal), 5 mg daily as needed.  A prescription has been provided for famotidine (Pepcid), 20 mg twice daily as needed.  Should there be a significant increase or change in symptoms, a journal is to be kept recording any foods eaten, beverages consumed, medications taken within a  6 hour period prior to the onset of symptoms, as well as record activities being performed, and environmental conditions. For any symptoms concerning for anaphylaxis, epinephrine is to be administered and 911 is to be called immediately.  A prescription has been provided for epinephrine 0.3 mg autoinjector (AuviQ) 2 pack along with instructions for its proper administration.  Angioedema Associated angioedema occurs in up to 50% of patients with chronic urticaria.  Treatment/diagnostic plan as outlined above.  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  It is unclear if he had an IgE mediated allergy or if this year quantity of the venom induced the anaphylaxis.  Laboratory order form has been provided for serum specific IgE against fire ant venom.  For now, continue careful avoidance of fire ants and have access to epinephrine autoinjector.  Epistaxis  Proper technique for stanching epistaxis has been discussed and demonstrated.  Nasal saline spray and/or nasal saline gel is recommended to moisturize nasal mucosa.  The use of a cool-mist humidifier during the night is recommended.  During epistaxis, if needed, oxymetazoline (Afrin) nasal spray may be applied to a cotton ball to help stanch the blood flow.  If this problem persists or progresses, otolaryngology evaluation may be warranted.    Meds ordered this encounter  Medications  . levocetirizine (XYZAL) 5 MG tablet    Sig: Take 1 tablet (5 mg total) by mouth every evening.    Dispense:  30 tablet    Refill:  5  . EPINEPHrine (AUVI-Q) 0.3 mg/0.3 mL IJ SOAJ injection    Sig: Inject 0.3 mLs (0.3 mg total) into the muscle once for 1 dose. As directed for life-threatening allergic reactions    Dispense:  2 each    Refill:  1    [phone]  . famotidine (PEPCID) 20 MG tablet    Sig: Take 1 tablet (20 mg total) by mouth 2 (two) times daily.    Dispense:  60 tablet    Refill:  5      Diagnostics: Environmental skin testing: Reactive to grass pollen. Food allergen skin testing: Negative despite a positive histamine control.    Physical examination: Blood pressure 130/82, pulse 98, temperature (!) 96.6 F (35.9 C), temperature source Temporal, resp. rate 16, height 6' 2"  (1.88 m), weight 255 lb 12.8 oz (116 kg), SpO2 98 %.  General: Alert, interactive, in no acute distress. HEENT: TMs pearly gray, turbinates mildly edematous with clear discharge, post-pharynx mildly erythematous. Neck: Supple without lymphadenopathy. Lungs: Clear to auscultation without wheezing, rhonchi or rales. CV: Normal S1, S2 without murmurs. Abdomen: Nondistended, nontender. Skin: Warm and dry, without lesions or rashes. Extremities:  No clubbing, cyanosis or edema. Neuro:   Grossly intact.  Review of systems:  Review of systems negative except as noted in HPI / PMHx or noted below: Review of Systems  Constitutional: Negative.   HENT: Negative.   Eyes: Negative.   Respiratory: Negative.   Cardiovascular: Negative.   Gastrointestinal: Negative.   Genitourinary: Negative.   Musculoskeletal: Negative.   Skin: Negative.   Neurological: Negative.   Endo/Heme/Allergies: Negative.   Psychiatric/Behavioral: Negative.     Past medical history:  Past Medical History:  Diagnosis Date  . Hypertension     Past surgical history:  History reviewed. No pertinent surgical history.  Family history: Family History  Problem Relation Age of Onset  . Hypertension Mother   . Healthy Father   . Stroke Maternal Grandfather     Social history:  Social History   Socioeconomic History  . Marital status: Single    Spouse name: Not on file  . Number of children: Not on file  . Years of education: Not on file  . Highest education level: Not on file  Occupational History  . Not on file  Tobacco Use  . Smoking status: Never Smoker  . Smokeless tobacco: Current User    Types: Chew   Substance and Sexual Activity  . Alcohol use: Yes    Comment: daily  . Drug use: No  . Sexual activity: Not on file  Other Topics Concern  . Not on file  Social History Narrative  . Not on file   Social Determinants of Health   Financial Resource Strain:   . Difficulty of Paying Living Expenses: Not on file  Food Insecurity:   . Worried About Charity fundraiser in the Last Year: Not on file  . Ran Out of Food in the Last Year: Not on file  Transportation Needs:   . Lack of Transportation (Medical): Not on file  . Lack of Transportation (Non-Medical): Not on file  Physical Activity:   . Days of Exercise per Week: Not on file  . Minutes of Exercise per Session: Not on file  Stress:   . Feeling of Stress : Not on file  Social Connections:   . Frequency of Communication with Friends and Family: Not on file  . Frequency of Social Gatherings with Friends and Family: Not on file  . Attends Religious Services: Not on file  . Active Member of Clubs or Organizations: Not on file  . Attends Archivist Meetings: Not on file  . Marital Status: Not on file  Intimate Partner Violence:   . Fear of Current or Ex-Partner: Not on file  . Emotionally Abused: Not on file  . Physically Abused: Not on file  . Sexually Abused: Not on file    Environmental History: The patient lives in a 36 year old house with hardwood floors throughout, gassy, and central air.  There is no known mold/water damage in the home.  There is a dog in the home which has access to his bedroom.  He is a non-smoker.  Current Outpatient Medications  Medication Sig Dispense Refill  . cetirizine (ZYRTEC) 10 MG tablet Take 10 mg by mouth daily.    Marland Kitchen EPINEPHrine (AUVI-Q) 0.3 mg/0.3 mL IJ SOAJ injection Inject 0.3 mLs (0.3 mg total) into the muscle once for 1 dose. As directed for life-threatening allergic reactions 2 each 1  . famotidine (PEPCID) 20 MG tablet Take 1 tablet (20 mg total) by mouth 2 (two) times  daily. 60 tablet 5  . levocetirizine (XYZAL) 5 MG tablet Take 1 tablet (5 mg total) by mouth every evening. 30 tablet 5   No current facility-administered medications for this visit.    Known medication allergies: No Known Allergies  I appreciate the opportunity to take part in Quanta's care. Please do not hesitate to contact me with questions.  Sincerely,   R. Edgar Frisk, MD

## 2019-04-16 NOTE — Assessment & Plan Note (Addendum)
Unclear etiology. Skin tests to select food allergens were negative today.  Environmental skin test revealed reactivity to pollen, however the symptoms have occurred throughout the winter in the absence of pollen exposure.  NSAIDs and emotional stress commonly exacerbate urticaria but are not the underlying etiology in this case. Physical urticarias are negative by history (i.e. pressure-induced, temperature, vibration, solar, etc.). History and lesions are not consistent with urticaria pigmentosa so I am not suspicious for mastocytosis. There are no concomitant symptoms concerning for anaphylaxis or constitutional symptoms worrisome for an underlying malignancy. We will rule out other potential etiologies with labs. For symptom relief, patient is to take oral antihistamines as directed.  The following labs have been ordered: FCeRI antibody, anti-thyroglobulin antibody, thyroid peroxidase antibody, C4 level, tryptase, H. pylori serology, CBC, CMP, ESR, ANA, and alpha-gal panel.  The patient will be called with further recommendations after lab results have returned.  Instructions have been discussed and provided for H1/H2 receptor blockade with titration to find lowest effective dose.  A prescription has been provided for levocetirizine (Xyzal), 5 mg daily as needed.  A prescription has been provided for famotidine (Pepcid), 20 mg twice daily as needed.  Should there be a significant increase or change in symptoms, a journal is to be kept recording any foods eaten, beverages consumed, medications taken within a 6 hour period prior to the onset of symptoms, as well as record activities being performed, and environmental conditions. For any symptoms concerning for anaphylaxis, epinephrine is to be administered and 911 is to be called immediately.  A prescription has been provided for epinephrine 0.3 mg autoinjector (AuviQ) 2 pack along with instructions for its proper administration.

## 2019-04-16 NOTE — Patient Instructions (Addendum)
Recurrent urticaria Unclear etiology. Skin tests to select food allergens were negative today.  Environmental skin test revealed reactivity to pollen, however the symptoms have occurred throughout the winter in the absence of pollen exposure.  NSAIDs and emotional stress commonly exacerbate urticaria but are not the underlying etiology in this case. Physical urticarias are negative by history (i.e. pressure-induced, temperature, vibration, solar, etc.). History and lesions are not consistent with urticaria pigmentosa so I am not suspicious for mastocytosis. There are no concomitant symptoms concerning for anaphylaxis or constitutional symptoms worrisome for an underlying malignancy. We will rule out other potential etiologies with labs. For symptom relief, patient is to take oral antihistamines as directed.  The following labs have been ordered: FCeRI antibody, anti-thyroglobulin antibody, thyroid peroxidase antibody, C4 level, tryptase, H. pylori serology, CBC, CMP, ESR, ANA, and alpha-gal panel.  The patient will be called with further recommendations after lab results have returned.  Instructions have been discussed and provided for H1/H2 receptor blockade with titration to find lowest effective dose.  A prescription has been provided for levocetirizine (Xyzal), 5 mg daily as needed.  A prescription has been provided for famotidine (Pepcid), 20 mg twice daily as needed.  Should there be a significant increase or change in symptoms, a journal is to be kept recording any foods eaten, beverages consumed, medications taken within a 6 hour period prior to the onset of symptoms, as well as record activities being performed, and environmental conditions. For any symptoms concerning for anaphylaxis, epinephrine is to be administered and 911 is to be called immediately.  A prescription has been provided for epinephrine 0.3 mg autoinjector (AuviQ) 2 pack along with instructions for its proper  administration.  Angioedema Associated angioedema occurs in up to 50% of patients with chronic urticaria.  Treatment/diagnostic plan as outlined above.  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  It is unclear if he had an IgE mediated allergy or if this year quantity of the venom induced the anaphylaxis.  Laboratory order form has been provided for serum specific IgE against fire ant venom.  For now, continue careful avoidance of fire ants and have access to epinephrine autoinjector.  Epistaxis  Proper technique for stanching epistaxis has been discussed and demonstrated.  Nasal saline spray and/or nasal saline gel is recommended to moisturize nasal mucosa.  The use of a cool-mist humidifier during the night is recommended.  During epistaxis, if needed, oxymetazoline (Afrin) nasal spray may be applied to a cotton ball to help stanch the blood flow.  If this problem persists or progresses, otolaryngology evaluation may be warranted.    When lab results have returned you will be called with further recommendations. With the newly implemented Cures Act, the labs may be visible to you at the same time they become visible to [place]. However, the results will typically not be addressed until all of the results are back, so please be patient.  Until you have heard from [place], please continue the treatment plan as outlined on your take home sheet.   Urticaria (Hives)  . Levocetirizine (Xyzal) 5 mg twice a day and famotidine (Pepcid) 20 mg twice a day. If no symptoms for 7-14 days then decrease to. . Levocetirizine (Xyzal) 5 mg twice a day and famotidine (Pepcid) 20 mg once a day.  If no symptoms for 7-14 days then decrease to. . Levocetirizine (Xyzal) 5 mg twice a day.  If no symptoms for 7-14 days then decrease to. . Levocetirizine (Xyzal)  5 mg once a day.   May use Benadryl (diphenhydramine) as needed for breakthrough symptoms       If  symptoms return, then step up dosage

## 2019-04-16 NOTE — Assessment & Plan Note (Deleted)
   Proper technique for stanching epistaxis has been discussed and demonstrated.  Nasal saline spray and/or nasal saline gel is recommended to moisturize nasal mucosa.  The use of a cool-mist humidifier during the night is recommended.  During epistaxis, if needed, oxymetazoline (Afrin) nasal spray may be applied to a cotton ball to help stanch the blood flow.  If this problem persists or progresses, otolaryngology evaluation may be warranted.  

## 2019-04-16 NOTE — Assessment & Plan Note (Signed)
   Proper technique for stanching epistaxis has been discussed and demonstrated.  Nasal saline spray and/or nasal saline gel is recommended to moisturize nasal mucosa.  The use of a cool-mist humidifier during the night is recommended.  During epistaxis, if needed, oxymetazoline (Afrin) nasal spray may be applied to a cotton ball to help stanch the blood flow.  If this problem persists or progresses, otolaryngology evaluation may be warranted.  

## 2019-04-16 NOTE — Assessment & Plan Note (Signed)
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  It is unclear if he had an IgE mediated allergy or if this year quantity of the venom induced the anaphylaxis.  Laboratory order form has been provided for serum specific IgE against fire ant venom.  For now, continue careful avoidance of fire ants and have access to epinephrine autoinjector.

## 2019-04-17 ENCOUNTER — Telehealth: Payer: Self-pay

## 2019-04-17 NOTE — Telephone Encounter (Signed)
If this problem does not resolve after eating some food and the patient believes that the symptoms are due to the levocetirizine (Xyzal), he should switch to using fexofenadine (Allegra) 180 mg instead of the levocetirizine (Xyzal).  The Allegra cannot cross the blood-brain barrier and therefore cannot cause dizziness, somnolence, etc. Thanks.

## 2019-04-17 NOTE — Telephone Encounter (Signed)
Left a voicemail to follow up with patient.

## 2019-04-17 NOTE — Telephone Encounter (Signed)
Pt calling in today, states that he is feeling light headed, weak and dizzy, has flushed skin.  Pt states that after taking the medication xyzal, he felt like this.  He states that as of now he hasn't eaten, but is trying to eat something now to see how he feels.  Dr Nunzio Cobbs, please advise

## 2019-04-23 LAB — COMPREHENSIVE METABOLIC PANEL
ALT: 16 IU/L (ref 0–44)
AST: 21 IU/L (ref 0–40)
Albumin/Globulin Ratio: 1.6 (ref 1.2–2.2)
Albumin: 4.5 g/dL (ref 4.0–5.0)
Alkaline Phosphatase: 67 IU/L (ref 39–117)
BUN/Creatinine Ratio: 13 (ref 9–20)
BUN: 12 mg/dL (ref 6–20)
Bilirubin Total: 0.5 mg/dL (ref 0.0–1.2)
CO2: 20 mmol/L (ref 20–29)
Calcium: 9.3 mg/dL (ref 8.7–10.2)
Chloride: 102 mmol/L (ref 96–106)
Creatinine, Ser: 0.96 mg/dL (ref 0.76–1.27)
GFR calc Af Amer: 118 mL/min/{1.73_m2} (ref >59)
GFR calc non Af Amer: 102 mL/min/{1.73_m2} (ref >59)
Globulin, Total: 2.8 g/dL (ref 1.5–4.5)
Glucose: 89 mg/dL (ref 65–99)
Potassium: 4.5 mmol/L (ref 3.5–5.2)
Sodium: 139 mmol/L (ref 134–144)
Total Protein: 7.3 g/dL (ref 6.0–8.5)

## 2019-04-23 LAB — SEDIMENTATION RATE: Sed Rate: 2 mm/hr (ref 0–15)

## 2019-04-23 LAB — ALPHA-GAL PANEL
Alpha Gal IgE*: 1.75 kU/L — ABNORMAL HIGH (ref <0.10)
Beef (Bos spp) IgE: 0.53 kU/L — ABNORMAL HIGH (ref <0.35)
Class Interpretation: 1
Lamb/Mutton (Ovis spp) IgE: 0.11 kU/L (ref <0.35)
Pork (Sus spp) IgE: 0.33 kU/L (ref <0.35)

## 2019-04-23 LAB — H PYLORI, IGM, IGG, IGA AB
H pylori, IgM Abs: <9 units (ref 0.0–8.9)
H. pylori, IgA Abs: 9.9 units — ABNORMAL HIGH (ref 0.0–8.9)
H. pylori, IgG AbS: 0.11 Index Value (ref 0.00–0.79)

## 2019-04-23 LAB — CBC
Hematocrit: 50.3 % (ref 37.5–51.0)
Hemoglobin: 17.4 g/dL (ref 13.0–17.7)
MCH: 31.6 pg (ref 26.6–33.0)
MCHC: 34.6 g/dL (ref 31.5–35.7)
MCV: 92 fL (ref 79–97)
Platelets: 224 10*3/uL (ref 150–450)
RBC: 5.5 x10E6/uL (ref 4.14–5.80)
RDW: 11.9 % (ref 11.6–15.4)
WBC: 9 10*3/uL (ref 3.4–10.8)

## 2019-04-23 LAB — ALLERGEN FIRE ANT: I070-IgE Fire Ant (Invicta): 30.2 kU/L — AB

## 2019-04-23 LAB — TRYPTASE: Tryptase: 6.1 ug/L (ref 2.2–13.2)

## 2019-04-23 LAB — ANA: ANA Titer 1: NEGATIVE

## 2019-04-23 LAB — THYROID ANTIBODIES
Thyroglobulin Antibody: <1 IU/mL (ref 0.0–0.9)
Thyroperoxidase Ab SerPl-aCnc: <9 IU/mL (ref 0–34)

## 2019-04-23 LAB — CHRONIC URTICARIA: cu index: 8.4 (ref <10)

## 2019-04-26 ENCOUNTER — Telehealth: Payer: Self-pay

## 2019-04-26 DIAGNOSIS — T783XXA Angioneurotic edema, initial encounter: Secondary | ICD-10-CM

## 2019-04-26 DIAGNOSIS — K219 Gastro-esophageal reflux disease without esophagitis: Secondary | ICD-10-CM

## 2019-04-26 DIAGNOSIS — T7840XA Allergy, unspecified, initial encounter: Secondary | ICD-10-CM

## 2019-04-26 DIAGNOSIS — L500 Allergic urticaria: Secondary | ICD-10-CM

## 2019-05-14 LAB — H. PYLORI BREATH TEST: H pylori Breath Test: NEGATIVE

## 2019-06-10 NOTE — Progress Notes (Deleted)
Patient: Lawrence Andrews, Male    DOB: September 13, 1985, 34 y.o.   MRN: [id number] Visit Date: 06/10/2019  Today's Provider: Margaretann Loveless, PA-C   No chief complaint on file.  Subjective:     Annual physical exam Lawrence Andrews is a 34 y.o. male who presents today for health maintenance and complete physical. He feels {DESC; WELL/FAIRLY WELL/POORLY:18703}. He reports exercising ***. He reports he is sleeping {DESC; WELL/FAIRLY WELL/POORLY:18703}.  -----------------------------------------------------------------   Review of Systems  Social History      He         Social History   Socioeconomic History  . Marital status: Not on file    Spouse name: Not on file  . Number of children: Not on file  . Years of education: Not on file  . Highest education level: Not on file  Occupational History  . Not on file  Tobacco Use  . Smoking status: Not on file  Substance and Sexual Activity  . Alcohol use: Not on file  . Drug use: Not on file  . Sexual activity: Not on file  Other Topics Concern  . Not on file  Social History Narrative  . Not on file   Social Determinants of Health   Financial Resource Strain:   . Difficulty of Paying Living Expenses:   Food Insecurity:   . Worried About Programme researcher, broadcasting/film/video in the Last Year:   . Barista in the Last Year:   Transportation Needs:   . Freight forwarder (Medical):   Marland Kitchen Lack of Transportation (Non-Medical):   Physical Activity:   . Days of Exercise per Week:   . Minutes of Exercise per Session:   Stress:   . Feeling of Stress :   Social Connections:   . Frequency of Communication with Friends and Family:   . Frequency of Social Gatherings with Friends and Family:   . Attends Religious Services:   . Active Member of Clubs or Organizations:   . Attends Banker Meetings:   Marland Kitchen Marital Status:     No past medical history on file.   There are no problems to display for this  patient.   *** The histories are not reviewed yet. Please review them in the "History" navigator section and refresh this SmartLink.  Family History        No family status information on file.        His family history is not on file.      Not on File  No current outpatient medications on file.   No care team member to display    Objective:    Vitals: There were no vitals taken for this visit.  There were no vitals filed for this visit.   Physical Exam   Depression Screen No flowsheet data found.     Assessment & Plan:     Routine Health Maintenance and Physical Exam  Exercise Activities and Dietary recommendations Goals   None      There is no immunization history on file for this patient.  Health Maintenance  Topic Date Due  . HIV Screening  Never done  . TETANUS/TDAP  Never done  . INFLUENZA VACCINE  Never done     Discussed health benefits of physical activity, and encouraged him to engage in regular exercise appropriate for his age and condition.    --------------------------------------------------------------------    Margaretann Loveless, PA-C  [facility]

## 2019-06-12 ENCOUNTER — Other Ambulatory Visit: Payer: Self-pay | Admitting: Gastroenterology

## 2019-06-12 DIAGNOSIS — R1312 Dysphagia, oropharyngeal phase: Secondary | ICD-10-CM

## 2019-06-13 ENCOUNTER — Ambulatory Visit: Payer: Self-pay | Admitting: Physician Assistant

## 2019-06-17 ENCOUNTER — Ambulatory Visit
Admission: RE | Admit: 2019-06-17 | Discharge: 2019-06-17 | Disposition: A | Discharge status: Home / Self Care | Payer: No Typology Code available for payment source | Source: Ambulatory Visit | Attending: Gastroenterology | Admitting: Gastroenterology

## 2019-06-17 ENCOUNTER — Other Ambulatory Visit: Payer: Self-pay

## 2019-06-17 DIAGNOSIS — R1312 Dysphagia, oropharyngeal phase: Secondary | ICD-10-CM

## 2019-06-21 ENCOUNTER — Ambulatory Visit: Payer: No Typology Code available for payment source

## 2019-07-15 NOTE — Progress Notes (Signed)
New patient visit   Patient: Lawrence Andrews   DOB: November 11, 1985   34 y.o. Male  MRN: [id number] Visit Date: 07/16/2019  Today's healthcare provider: Jairo Ben, FNP   Rae Lips as a scribe for Beverely Pace Anaira Seay, FNP.,have documented all relevant documentation on the behalf of Beverely Pace Rakiya Krawczyk, FNP,as directed by  Jairo Ben, FNP while in the presence of Jairo Ben, FNP. Chief Complaint  Patient presents with  . New Patient (Initial Visit)   Subjective    Lawrence Andrews is a 34 y.o. male who presents today as a new patient to establish care.  HPI Patient comes into our office to establish care, patient was a former patient of Dr. Dareen Piano from [facility].   Patient reports that he has been seen by Dr. Dareen Piano for primary care in the past and he is changing providers. Patient has been on Lexapro 10 mg for 1 month, he reports he is feeling better with his anxiety, having less panic attacks.  He does have palpitations associated with his panic attacks.  He has palpitations at no other time he reports.  He denies any chest pain shortness of breath neck pain jaw pain or any radiating symptoms.  He denies any nausea with the palpitations. Is also been using Klonopin 1 mg around 1 time daily for anxiety, previous note from his previous provider said in goal treatment was to remove Klonopin and become stable on Lexapro. Provider discussed with patient that I will keep with this plan with expectation that we will discontinue the Klonopin or he will see psychiatry.  Patient is in agreement and he declines a psychiatry referral today.  Feels that his anxiety level could be better his GAD score is 13 today.  Patient also reports that he has a history of vitamin B12 deficiency, he does report that he is taking oral capsules that he swallowed previously has not tried sublingual, however he is requesting a B12 infusion in the past  month.  He reports he did this out of town in [place], he said it was like a medical health clinic.  Does not know what his most recent B12 level was.  He denies any history of pernicious anemia.  Also request that his testosterone be checked today, he reports that he has fatigue, no sexual dysfunction or concerns in that area.  He does report that he has taken prescription testosterone in the past.  I reviewed with him that we will not manage his testosterone in this clinic but I will however check a level for him between 8 and 10 AM in the morning of fasting labs and that if it is low it should be repeated and then he would then be referred to endocrinology.  It is in agreement with this plan.  He denies any black market testosterone use.  Denies any rectal pain or pressure.  He does report that he has some rectal bleeding occasionally due to his hemorrhoids.  He has seen gastroenterology this year, however he was given Protonix for GERD and was told to take it for 6 weeks at 40 mg.  He reports he did not have a rectal exam or endoscopy. He is offered a rectal exam at this office visit however he declines.  He does self testicular exams on a self denies any changes, he declines testicular exam at today's office visit.  He also wants to discuss a rash that he has under his neck facemask  that he has been wearing for months due to COVID-19 pandemic.  He reports he has had this in the past and took Diflucan and it resolved.  He also has some symptoms of jock itch in his bilateral groins.  He occasionally gets a erythematous itchy rash under his breast as well.  He denies any drainage.  He denies having any of those symptoms today however he is concerned about the neck rash.  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  For this he has an epinephrine pen.  He reports that he does  not need a refill and that his epinephrine  is in date and not expired.  He is a aware that he can reach out for an epinephrine pen refill at any time.  Reports he drinks beer 4-6 times a week, he denies being able to stop drinking, he denies any anger issues.  He is advised that he should not drink with Klonopin.  Denies any dizziness lightheadedness, presyncopal or syncopal episodes. Patient  denies any fever, body aches,chills, rash, chest pain, shortness of breath, nausea, vomiting, or diarrhea.    Past Medical History:  Diagnosis Date  . Allergy   . Allergy to alpha-gal   . Anxiety   . Depression   . Hypertension    Past Surgical History:  Procedure Laterality Date  . NO PAST SURGERIES     Family Status  Relation Name Status  . Mother  Alive  . Father  Alive  . MGM  Deceased  . MGF  Deceased  . PGM  Alive  . PGF  Alive   Family History  Problem Relation Age of Onset  . Hypertension Mother   . Healthy Father   . Stroke Maternal Grandfather    Social History   Socioeconomic History  . Marital status: Single    Spouse name: Not on file  . Number of children: Not on file  . Years of education: Not on file  . Highest education level: Not on file  Occupational History  . Not on file  Tobacco Use  . Smoking status: Never Smoker  . Smokeless tobacco: Current User    Types: Chew  Substance and Sexual Activity  . Alcohol use: Yes    Comment: daily  . Drug use: No  . Sexual activity: Not on file  Other Topics Concern  . Not on file  Social History Narrative  . Not on file   Social Determinants of Health   Financial Resource Strain:   . Difficulty of Paying Living Expenses:   Food Insecurity:   . Worried About Charity fundraiser in the Last Year:   . Arboriculturist in the Last Year:   Transportation Needs:   . Film/video editor (Medical):   Marland Kitchen Lack of Transportation (Non-Medical):   Physical Activity:   . Days of Exercise per Week:   . Minutes of  Exercise per Session:   Stress:   . Feeling of Stress :   Social Connections:   . Frequency of Communication with Friends and Family:   . Frequency of Social Gatherings with Friends and Family:   . Attends Religious Services:   . Active Member of Clubs or Organizations:   . Attends Archivist Meetings:   Marland Kitchen Marital Status:    Outpatient Medications Prior to Visit  Medication Sig  . clonazePAM (KLONOPIN) 1 MG disintegrating tablet Take by mouth.  . [DISCONTINUED] escitalopram (LEXAPRO) 10 MG tablet Take by mouth.  . cetirizine (ZYRTEC) 10 MG tablet Take 10 mg by mouth daily.  . famotidine (PEPCID) 20 MG tablet Take 1 tablet (20 mg total) by mouth 2 (two) times daily.  Marland Kitchen levocetirizine (XYZAL) 5 MG tablet Take 1 tablet (5 mg total) by mouth every evening.  . pantoprazole (PROTONIX) 40 MG tablet Take 40 mg by mouth daily.   No facility-administered medications prior to visit.   Allergies  Allergen Reactions  . Alpha-Gal     Red meat  . Other     Fire ants and pollen     There is no immunization history on file for this patient.  Health Maintenance  Topic Date Due  . HIV Screening  Never done  . COVID-19 Vaccine (1) Never done  . TETANUS/TDAP  Never done  . INFLUENZA VACCINE  10/13/2019    Patient Care Team: Berniece Pap, FNP as PCP - General (Family Medicine)  Review of Systems  Cardiovascular: Positive for palpitations.  Musculoskeletal: Positive for arthralgias.  Allergic/Immunologic: Positive for food allergies.  Neurological: Positive for dizziness and numbness.  Psychiatric/Behavioral: The patient is nervous/anxious.   All other systems reviewed and are negative.     Objective    BP 122/82   Pulse 70   Temp (!) 96.6 F (35.9 C) (Oral)   Resp 16   Ht 6\' 2"  (1.88 m)   Wt 250 lb 12.8 oz (113.8 kg)   SpO2 98%   BMI 32.20 kg/m  Physical Exam Vitals reviewed. Exam conducted with a chaperone present.  Constitutional:      General: He is  not in acute distress.    Appearance: Normal appearance. He is not ill-appearing, toxic-appearing or diaphoretic.     Comments: Patient appers well, not sickly. Speaking in complete sentences. Patient moves on and off of exam table and in room without difficulty. Gait is normal in hall and in room. Patient is oriented to person place time and situation. Patient answers questions appropriately and engages eye contact and verbal dialect with provider.   HENT:     Head: Normocephalic and atraumatic.     Right Ear: Tympanic membrane, ear canal and external ear normal. There is no impacted cerumen.     Left Ear: Tympanic membrane, ear canal and external ear normal. There is no impacted cerumen.     Nose: Nose normal. No congestion or rhinorrhea.     Mouth/Throat:     Mouth: Mucous membranes are moist.     Pharynx: Oropharynx is clear. No oropharyngeal exudate or posterior oropharyngeal erythema.  Eyes:     General: No scleral icterus.    Extraocular Movements: Extraocular movements intact.     Conjunctiva/sclera: Conjunctivae normal.     Pupils: Pupils are equal, round, and reactive to light.  Neck:     Comments:   Cardiovascular:     Rate and Rhythm: Normal rate and regular rhythm.     Pulses: Normal pulses.     Heart sounds: Normal heart sounds. No murmur. No friction rub. No gallop.   Pulmonary:     Effort: Pulmonary effort is normal. No respiratory distress.     Breath sounds: Normal breath sounds. No stridor. No wheezing, rhonchi or rales.  Chest:     Chest wall: No tenderness.  Abdominal:     General: Bowel sounds are normal. There is no  distension.     Palpations: Abdomen is soft. There is no mass.     Tenderness: There is no abdominal tenderness. There is no right CVA tenderness, left CVA tenderness, guarding or rebound.     Hernia: No hernia is present.  Genitourinary:    Testes: Cremasteric reflex is present.        Right: Mass, tenderness or swelling not present.         Left: Mass, tenderness or swelling not present.     Epididymis:     Right: Normal. Not inflamed. No tenderness.     Left: Normal. Not inflamed or enlarged. No mass or tenderness.     Tanner stage (genital): 5.     Comments: Plan by patient, offered male provider, still declined by patient.  He is aware that he can reach out to me for referral to colonoscopy or further evaluation by gastroenterology should he prefer.  Patient verbalized understanding reports he will reach out if symptoms persist.  He declines testicular exam, declines any known issues. Exams were recommended by this provider. Musculoskeletal:        General: No tenderness or deformity. Normal range of motion.     Cervical back: Normal range of motion and neck supple.  Skin:    General: Skin is warm and dry.     Capillary Refill: Capillary refill takes less than 2 seconds.          Comments: Fungal hyperpigmentation  Neurological:     General: No focal deficit present.     Mental Status: He is alert and oriented to person, place, and time. Mental status is at baseline.     Cranial Nerves: No cranial nerve deficit.     Sensory: No sensory deficit.     Motor: No weakness.     Coordination: Coordination normal.     Gait: Gait normal.     Deep Tendon Reflexes: Reflexes normal.  Psychiatric:        Mood and Affect: Mood normal.        Behavior: Behavior normal.        Thought Content: Thought content normal.        Judgment: Judgment normal.    GAD 13   Denies any suicidal or homicidal intents or ideations now or in the past.  Depression Screen PHQ 2/9 Scores 07/16/2019  PHQ - 2 Score 1  PHQ- 9 Score 4   No results found for any visits on 07/16/19.  Assessment & Plan      Encounter for routine adult health examination without abnormal findings  Low testosterone - Plan: TSH, Testosterone, PSA  Hyperlipidemia, mixed - Plan: Lipid panel, Comprehensive metabolic panel  Essential hypertension - Plan:  TSH  Hypotestosteronemia in male - Plan: TSH  Vitamin D deficiency - Plan: Vitamin D (25 hydroxy)  History of panic attacks  Generalized anxiety disorder - Plan: escitalopram (LEXAPRO) 5 MG tablet  Vitamin B12 deficiency - Plan: Vitamin B12, Intrinsic Factor Antibodies  Fatigue, unspecified type - Plan: CBC with Differential/Platelet  Skin yeast infection - Plan: fluconazole (DIFLUCAN) 150 MG tablet  Body mass index (BMI) of 32.0-32.9 in adult  Moderate episode of recurrent major depressive disorder (HCC), Chronic  Anxiety   Orders Placed This Encounter  Procedures  . TSH  . Lipid panel  . Comprehensive metabolic panel  . CBC with Differential/Platelet  . Vitamin D (25 hydroxy)  . Vitamin B12  . Testosterone  . Intrinsic Factor Antibodies  . PSA  Discussed we will increase his Lexapro to 15 mg total.  Reviewed also blackbox warning for drug, and serotonin syndrome.  Patient is to call the office and discontinue and seek after-hours emergency care if needed should he have any symptoms of suicidal or worsening depression.  Patient verbalized understanding he also has a 10 mg bottle of a 30-day supply of Lexapro at home.  He is aware that he will only take a total of 15 mg a day.  Also sent in Diflucan to take as directed for skin fungal rash.  This is not improved afterwards he may call me for some topical cream in the area.  Verbalized understanding, he also understands signs and symptoms of infection. Meds ordered this encounter  Medications  . escitalopram (LEXAPRO) 5 MG tablet    Sig: Take 1 tablet (5 mg total) by mouth daily.    Dispense:  30 tablet    Refill:  0    Take 10mg  and 5mg  daily for a total of 15mg .  . fluconazole (DIFLUCAN) 150 MG tablet    Sig: Take 1 tablet (150 mg total) by mouth as directed. Take one tablet by mouth on day 1. May repeat dose of one tablet by mouth on day four. Avoid alcohol for 7 days.    Dispense:  2 tablet    Refill:  0   I  again oriented and indurated to him as his previous primary care had that Klonopin will not be given long-term and that if he feels Lexapro is not helping once we meet doses maximum or change therapy and is not working he will need to see psychiatry.  Did offer referral to psychiatry today however patient declines at this time. Should testosterone returned low, he is aware that he will be referred to endocrinology and that this clinic will not manage testosterone. Since he reports being on testosterone in the past I will check a PSA today.  Advised patient call the office or your primary care doctor for an appointment if no improvement within 72 hours or if any symptoms change or worsen at any time  Advised ER or urgent Care if after hours or on weekend. Call 911 for emergency symptoms at any time.Patinet verbalized understanding of all instructions given/reviewed and treatment plan and has no further questions or concerns at this time.     Return in about 4 weeks (around 08/13/2019) for Anxiety/Depression follow up .   Please repeat GAD 7 at this visit.  The entirety of the information documented in the History of Present Illness, Review of Systems and Physical Exam were personally obtained by me. Portions of this information were initially documented by the CMA and reviewed by me for thoroughness and accuracy.   Beverely PaceMichelle Smith Connor Foxworthy, FNP    Jairo BenMichelle Smith Jessup Ogas, FNP  [facility] [phone] (phone) [phone] (fax)  [facility]

## 2019-07-16 ENCOUNTER — Ambulatory Visit: Payer: No Typology Code available for payment source | Admitting: Adult Health

## 2019-07-16 ENCOUNTER — Other Ambulatory Visit: Payer: Self-pay

## 2019-07-16 ENCOUNTER — Encounter: Payer: Self-pay | Admitting: Adult Health

## 2019-07-16 VITALS — BP 122/82 | HR 70 | Temp 96.6°F | Resp 16 | Ht 74.0 in | Wt 250.8 lb

## 2019-07-16 DIAGNOSIS — Z6832 Body mass index (BMI) 32.0-32.9, adult: Secondary | ICD-10-CM

## 2019-07-16 DIAGNOSIS — F411 Generalized anxiety disorder: Secondary | ICD-10-CM

## 2019-07-16 DIAGNOSIS — E538 Deficiency of other specified B group vitamins: Secondary | ICD-10-CM | POA: Insufficient documentation

## 2019-07-16 DIAGNOSIS — R7989 Other specified abnormal findings of blood chemistry: Secondary | ICD-10-CM | POA: Diagnosis not present

## 2019-07-16 DIAGNOSIS — E782 Mixed hyperlipidemia: Secondary | ICD-10-CM | POA: Diagnosis not present

## 2019-07-16 DIAGNOSIS — I10 Essential (primary) hypertension: Secondary | ICD-10-CM

## 2019-07-16 DIAGNOSIS — Z0000 Encounter for general adult medical examination without abnormal findings: Secondary | ICD-10-CM | POA: Insufficient documentation

## 2019-07-16 DIAGNOSIS — F331 Major depressive disorder, recurrent, moderate: Secondary | ICD-10-CM

## 2019-07-16 DIAGNOSIS — Z8659 Personal history of other mental and behavioral disorders: Secondary | ICD-10-CM | POA: Insufficient documentation

## 2019-07-16 DIAGNOSIS — E559 Vitamin D deficiency, unspecified: Secondary | ICD-10-CM

## 2019-07-16 DIAGNOSIS — B372 Candidiasis of skin and nail: Secondary | ICD-10-CM | POA: Insufficient documentation

## 2019-07-16 DIAGNOSIS — R5383 Other fatigue: Secondary | ICD-10-CM | POA: Insufficient documentation

## 2019-07-16 DIAGNOSIS — F419 Anxiety disorder, unspecified: Secondary | ICD-10-CM | POA: Insufficient documentation

## 2019-07-16 DIAGNOSIS — Z113 Encounter for screening for infections with a predominantly sexual mode of transmission: Secondary | ICD-10-CM | POA: Insufficient documentation

## 2019-07-16 DIAGNOSIS — E291 Testicular hypofunction: Secondary | ICD-10-CM

## 2019-07-16 MED ORDER — ESCITALOPRAM OXALATE 5 MG PO TABS: 5.0000 mg | ORAL_TABLET | Freq: Every day | ORAL | 0 refills | Status: DC

## 2019-07-16 MED ORDER — FLUCONAZOLE 150 MG PO TABS: 150.0000 mg | ORAL_TABLET | ORAL | 0 refills | Status: DC

## 2019-07-16 NOTE — Patient Instructions (Addendum)
Health Maintenance, Male Adopting a healthy lifestyle and getting preventive care are important in promoting health and wellness. Ask your health care provider about:  The right schedule for you to have regular tests and exams.  Things you can do on your own to prevent diseases and keep yourself healthy. What should I know about diet, weight, and exercise? Eat a healthy diet   Eat a diet that includes plenty of vegetables, fruits, low-fat dairy products, and lean protein.  Do not eat a lot of foods that are high in solid fats, added sugars, or sodium. Maintain a healthy weight Body mass index (BMI) is a measurement that can be used to identify possible weight problems. It estimates body fat based on height and weight. Your health care provider can help determine your BMI and help you achieve or maintain a healthy weight. Get regular exercise Get regular exercise. This is one of the most important things you can do for your health. Most adults should:  Exercise for at least 150 minutes each week. The exercise should increase your heart rate and make you sweat (moderate-intensity exercise).  Do strengthening exercises at least twice a week. This is in addition to the moderate-intensity exercise.  Spend less time sitting. Even light physical activity can be beneficial. Watch cholesterol and blood lipids Have your blood tested for lipids and cholesterol at 36 years of age, then have this test every 5 years. You may need to have your cholesterol levels checked more often if:  Your lipid or cholesterol levels are high.  You are older than 36 years of age.  You are at high risk for heart disease. What should I know about cancer screening? Many types of cancers can be detected early and may often be prevented. Depending on your health history and family history, you may need to have cancer screening at various ages. This may include screening for:  Colorectal cancer.  Prostate  cancer.  Skin cancer.  Lung cancer. What should I know about heart disease, diabetes, and high blood pressure? Blood pressure and heart disease  High blood pressure causes heart disease and increases the risk of stroke. This is more likely to develop in people who have high blood pressure readings, are of African descent, or are overweight.  Talk with your health care provider about your target blood pressure readings.  Have your blood pressure checked: ? Every 3-5 years if you are 74-36 years of age. ? Every year if you are 66 years old or older.  If you are between the ages of 72 and 53 and are a current or former smoker, ask your health care provider if you should have a one-time screening for abdominal aortic aneurysm (AAA). Diabetes Have regular diabetes screenings. This checks your fasting blood sugar level. Have the screening done:  Once every three years after age 61 if you are at a normal weight and have a low risk for diabetes.  More often and at a younger age if you are overweight or have a high risk for diabetes. What should I know about preventing infection? Hepatitis B If you have a higher risk for hepatitis B, you should be screened for this virus. Talk with your health care provider to find out if you are at risk for hepatitis B infection. Hepatitis C Blood testing is recommended for:  Everyone born from [birth year] through 1967.  Anyone with known risk factors for hepatitis C. Sexually transmitted infections (STIs)  You should be screened each year  for STIs, including gonorrhea and chlamydia, if: ? You are sexually active and are younger than 36 years of age. ? You are older than 36 years of age and your health care provider tells you that you are at risk for this type of infection. ? Your sexual activity has changed since you were last screened, and you are at increased risk for chlamydia or gonorrhea. Ask your health care provider if you are at risk.  Ask your  health care provider about whether you are at high risk for HIV. Your health care provider may recommend a prescription medicine to help prevent HIV infection. If you choose to take medicine to prevent HIV, you should first get tested for HIV. You should then be tested every 3 months for as long as you are taking the medicine. Follow these instructions at home: Lifestyle  Do not use any products that contain nicotine or tobacco, such as cigarettes, e-cigarettes, and chewing tobacco. If you need help quitting, ask your health care provider.  Do not use street drugs.  Do not share needles.  Ask your health care provider for help if you need support or information about quitting drugs. Alcohol use  Do not drink alcohol if your health care provider tells you not to drink.  If you drink alcohol: ? Limit how much you have to 0-2 drinks a day. Be aware of how muEscitalopram tablets What is this medicine? ESCITALOPRAM (es sye TAL oh pram) is used to treat depression and certain types of anxiety. This medicine may be used for other purposes; ask your health care provider or pharmacist if you have questions. COMMON BRAND NAME(S): Lexapro What should I tell my health care provider before I take this medicine? They need to know if you have any of these conditions: bipolar disorder or a family history of bipolar disorder diabetes glaucoma heart disease kidney or liver disease receiving electroconvulsive therapy seizures (convulsions) suicidal thoughts, plans, or attempt by you or a family member an unusual or allergic reaction to escitalopram, the related drug citalopram, other medicines, foods, dyes, or preservatives pregnant or trying to become pregnant breast-feeding How should I use this medicine? Take this medicine by mouth with a glass of water. Follow the directions on the prescription label. You can take it with or without food. If it upsets your stomach, take it with food. Take your  medicine at regular intervals. Do not take it more often than directed. Do not stop taking this medicine suddenly except upon the advice of your doctor. Stopping this medicine too quickly may cause serious side effects or your condition may worsen. A special MedGuide will be given to you by the pharmacist with each prescription and refill. Be sure to read this information carefully each time. Talk to your pediatrician regarding the use of this medicine in children. Special care may be needed. Overdosage: If you think you have taken too much of this medicine contact a poison control center or emergency room at once. NOTE: This medicine is only for you. Do not share this medicine with others. What if I miss a dose? If you miss a dose, take it as soon as you can. If it is almost time for your next dose, take only that dose. Do not take double or extra doses. What may interact with this medicine? Do not take this medicine with any of the following medications: certain medicines for fungal infections like fluconazole, itraconazole, ketoconazole, posaconazole, voriconazole cisapride citalopram dronedarone linezolid MAOIs like Carbex,  Eldepryl, Marplan, Nardil, and Parnate methylene blue (injected into a vein) pimozide thioridazine This medicine may also interact with the following medications: alcohol amphetamines aspirin and aspirin-like medicines carbamazepine certain medicines for depression, anxiety, or psychotic disturbances certain medicines for migraine headache like almotriptan, eletriptan, frovatriptan, naratriptan, rizatriptan, sumatriptan, zolmitriptan certain medicines for sleep certain medicines that treat or prevent blood clots like warfarin, enoxaparin, dalteparin cimetidine diuretics dofetilide fentanyl furazolidone isoniazid lithium metoprolol NSAIDs, medicines for pain and inflammation, like ibuprofen or naproxen other medicines that prolong the QT interval (cause an  abnormal heart rhythm) procarbazine rasagiline supplements like St. John's wort, kava kava, valerian tramadol tryptophan ziprasidone This list may not describe all possible interactions. Give your health care provider a list of all the medicines, herbs, non-prescription drugs, or dietary supplements you use. Also tell them if you smoke, drink alcohol, or use illegal drugs. Some items may interact with your medicine. What should I watch for while using this medicine? Tell your doctor if your symptoms do not get better or if they get worse. Visit your doctor or health care professional for regular checks on your progress. Because it may take several weeks to see the full effects of this medicine, it is important to continue your treatment as prescribed by your doctor. Patients and their families should watch out for new or worsening thoughts of suicide or depression. Also watch out for sudden changes in feelings such as feeling anxious, agitated, panicky, irritable, hostile, aggressive, impulsive, severely restless, overly excited and hyperactive, or not being able to sleep. If this happens, especially at the beginning of treatment or after a change in dose, call your health care professional. Bonita Quin may get drowsy or dizzy. Do not drive, use machinery, or do anything that needs mental alertness until you know how this medicine affects you. Do not stand or sit up quickly, especially if you are an older patient. This reduces the risk of dizzy or fainting spells. Alcohol may interfere with the effect of this medicine. Avoid alcoholic drinks. Your mouth may get dry. Chewing sugarless gum or sucking hard candy, and drinking plenty of water may help. Contact your doctor if the problem does not go away or is severe. What side effects may I notice from receiving this medicine? Side effects that you should report to your doctor or health care professional as soon as possible: allergic reactions like skin rash,  itching or hives, swelling of the face, lips, or tongue anxious black, tarry stools changes in vision confusion elevated mood, decreased need for sleep, racing thoughts, impulsive behavior eye pain fast, irregular heartbeat feeling faint or lightheaded, falls feeling agitated, angry, or irritable hallucination, loss of contact with reality loss of balance or coordination loss of memory painful or prolonged erections restlessness, pacing, inability to keep still seizures stiff muscles suicidal thoughts or other mood changes trouble sleeping unusual bleeding or bruising unusually weak or tired vomiting Side effects that usually do not require medical attention (report to your doctor or health care professional if they continue or are bothersome): changes in appetite change in sex drive or performance headache increased sweating indigestion, nausea tremors This list may not describe all possible side effects. Call your doctor for medical advice about side effects. You may report side effects to FDA at 1-800-FDA-1088. Where should I keep my medicine? Keep out of reach of children. Store at room temperature between 15 and 30 degrees C (59 and 86 degrees F). Throw away any unused medicine after the expiration date. NOTE:  This sheet is a summary. It may not cover all possible information. If you have questions about this medicine, talk to your doctor, pharmacist, or health care provider.  2020 Elsevier/Gold Standard (2018-02-19 11:21:44) ? ch alcohol is in your drink. In the [place] one drink equals one 12 oz bottle of beer (355 mL), one 5 oz glass of wine (148 mL), or one 1 oz glass of hard liquor (44 mL). General instructions  Schedule regular health, dental, and eye exams.  Stay current with your vaccines.  Tell your health care provider if: ? You often feel depressed. ? You have ever been abused or do not feel safe at home. Summary  Adopting a healthy lifestyle and getting  preventive care are important in promoting health and wellness.  Follow your health care provider's instructions about healthy diet, exercising, and getting tested or screened for diseases.  Follow your health care provider's instructions on monitoring your cholesterol and blood pressure. This information is not intended to replace advice given to you by your health care provider. Make sure you discuss any questions you have with your health care provider. Document Revised: 02/21/2018 Document Reviewed: 02/21/2018 Elsevier Patient Education  2020 Else[employer] Inc[employer]  Serotonin Syndrome Serotonin is a chemical in your body (neurotransmitter) that helps to control several functions, such as:  Brain and nerve cell function.  Mood and emotions.  Memory.  Eating.  Sleeping.  Sexual activity.  Stress response. Having too much serotonin in your body can cause serotonin syndrome. This condition can be harmful to your brain and nerve cells. This can be a life-threatening condition. What are the causes? This condition may be caused by taking medicines or drugs that increase the level of serotonin in your body, such as:  Antidepressant medicines.  Migraine medicines.  Certain pain medicines.  Certain drugs, including ecstasy, LSD, cocaine, and amphetamines.  Over-the-counter cough or cold medicines that contain dextromethorphan.  Certain herbal supplements, including St. John's wort, ginseng, and nutmeg. This condition usually occurs when you take these medicines or drugs in combination, but it can also happen with a high dose of a single medicine or drug. What increases the risk? You are more likely to develop this condition if:  You just started taking a medicine or drug that increases the level of serotonin in the body.  You recently increased the dose of a medicine or drug that increases the level of serotonin in the body.  You take more than one medicine or drug that increases  the level of serotonin in the body. What are the signs or symptoms? Symptoms of this condition usually start within several hours of taking a medicine or drug. Symptoms may be mild or severe. Mild symptoms include:  Sweating.  Restlessness or agitation.  Muscle twitching or stiffness.  Rapid heart rate.  Nausea and vomiting.  Diarrhea.  Headache.  Shivering or goose bumps.  Confusion. Severe symptoms include:  Irregular heartbeat.  Seizures.  Loss of consciousness.  High fever. How is this diagnosed? This condition may be diagnosed based on:  Your medical history.  A physical exam.  Your prior use of drugs and medicines.  Blood or urine tests. These may be used to rule out other causes of your symptoms. How is this treated? The treatment for this condition depends on the severity of your symptoms.  For mild cases, stopping the medicine or drug that caused your condition is usually all that is needed.  For moderate to severe cases, treatment in a  hospital may be needed to prevent or manage life-threatening symptoms. This may include medicines to control your symptoms, IV fluids, interventions to support your breathing, and treatments to control your body temperature. Follow these instructions at home: Medicines   Take over-the-counter and prescription medicines only as told by your health care provider. This is important.  Check with your health care provider before you start taking any new prescriptions, over-the-counter medicines, herbs, or supplements.  Avoid combining any medicines that can cause this condition to occur. Lifestyle   Maintain a healthy lifestyle. ? Eat a healthy diet that includes plenty of vegetables, fruits, whole grains, low-fat dairy products, and lean protein. Do not eat a lot of foods that are high in fat, added sugars, or salt. ? Get the right amount and quality of sleep. Most adults need 7-9 hours of sleep each night. ? Make  time to exercise, even if it is only for short periods of time. Most adults should exercise for at least 150 minutes each week. ? Do not drink alcohol. ? Do not use illegal drugs, and do not take medicines for reasons other than they are prescribed. General instructions  Do not use any products that contain nicotine or tobacco, such as cigarettes and e-cigarettes. If you need help quitting, ask your health care provider.  Keep all follow-up visits as told by your health care provider. This is important. Contact a health care provider if:  Your symptoms do not improve or they get worse. Get help right away if you:  Have worsening confusion, severe headache, chest pain, high fever, seizures, or loss of consciousness.  Experience serious side effects of medicine, such as swelling of your face, lips, tongue, or throat.  Have serious thoughts about hurting yourself or others. These symptoms may represent a serious problem that is an emergency. Do not wait to see if the symptoms will go away. Get medical help right away. Call your local emergency services (911 in the [place] Do not drive yourself to the hospital. If you ever feel like you may hurt yourself or others, or have thoughts about taking your own life, get help right away. You can go to your nearest emergency department or call:  Your local emergency services (911 in the [place]  A suicide crisis helpline, such as the [place] at 409-174-1929. This is open 24 hours a day. Summary  Serotonin is a brain chemical that helps to regulate the nervous system. High levels of serotonin in the body can cause serotonin syndrome, which is a very dangerous condition.  This condition may be caused by taking medicines or drugs that increase the level of serotonin in your body.  Treatment depends on the severity of your symptoms. For mild cases, stopping the medicine or drug that caused your condition is usually all that  is needed.  Check with your health care provider before you start taking any new prescriptions, over-the-counter medicines, herbs, or supplements. This information is not intended to replace advice given to you by your health care provider. Make sure you discuss any questions you have with your health care provider. Document Revised: 04/07/2017 Document Reviewed: 04/07/2017 Elsevier Patient Education  2020 [place] for [employer] Calories are units of energy. Your body needs a certain amount of calories from food to keep you going throughout the day. When you eat more calories than your body needs, your body stores the extra calories as fat. When you eat fewer calories  than your body needs, your body burns fat to get the energy it needs. Calorie counting means keeping track of how many calories you eat and drink each day. Calorie counting can be helpful if you need to lose weight. If you make sure to eat fewer calories than your body needs, you should lose weight. Ask your health care provider what a healthy weight is for you. For calorie counting to work, you will need to eat the right number of calories in a day in order to lose a healthy amount of weight per week. A dietitian can help you determine how many calories you need in a day and will give you suggestions on how to reach your calorie goal.  A healthy amount of weight to lose per week is usually 1-2 lb (0.5-0.9 kg). This usually means that your daily calorie intake should be reduced by 500-750 calories.  Eating 1,200 - 1,500 calories per day can help most women lose weight.  Eating 1,500 - 1,800 calories per day can help most men lose weight. What is my plan? My goal is to have __________ calories per day. If I have this many calories per day, I should lose around __________ pounds per week. What do I need to know about calorie counting? In order to meet your daily calorie goal, you will need to:  Find out  how many calories are in each food you would like to eat. Try to do this before you eat.  Decide how much of the food you plan to eat.  Write down what you ate and how many calories it had. Doing this is called keeping a food log. To successfully lose weight, it is important to balance calorie counting with a healthy lifestyle that includes regular activity. Aim for 150 minutes of moderate exercise (such as walking) or 75 minutes of vigorous exercise (such as running) each week. Where do I find calorie information?  The number of calories in a food can be found on a Nutrition Facts label. If a food does not have a Nutrition Facts label, try to look up the calories online or ask your dietitian for help. Remember that calories are listed per serving. If you choose to have more than one serving of a food, you will have to multiply the calories per serving by the amount of servings you plan to eat. For example, the label on a package of bread might say that a serving size is 1 slice and that there are 90 calories in a serving. If you eat 1 slice, you will have eaten 90 calories. If you eat 2 slices, you will have eaten 180 calories. How do I keep a food log? Immediately after each meal, record the following information in your food log:  What you ate. Don't forget to include toppings, sauces, and other extras on the food.  How much you ate. This can be measured in cups, ounces, or number of items.  How many calories each food and drink had.  The total number of calories in the meal. Keep your food log near you, such as in a small notebook in your pocket, or use a mobile app or website. Some programs will calculate calories for you and show you how many calories you have left for the day to meet your goal. What are some calorie counting tips?   Use your calories on foods and drinks that will fill you up and not leave you hungry: ? Some examples of foods  that fill you up are nuts and nut butters,  vegetables, lean proteins, and high-fiber foods like whole grains. High-fiber foods are foods with more than 5 g fiber per serving. ? Drinks such as sodas, specialty coffee drinks, alcohol, and juices have a lot of calories, yet do not fill you up.  Eat nutritious foods and avoid empty calories. Empty calories are calories you get from foods or beverages that do not have many vitamins or protein, such as candy, sweets, and soda. It is better to have a nutritious high-calorie food (such as an avocado) than a food with few nutrients (such as a bag of chips).  Know how many calories are in the foods you eat most often. This will help you calculate calorie counts faster.  Pay attention to calories in drinks. Low-calorie drinks include water and unsweetened drinks.  Pay attention to nutrition labels for "low fat" or "fat free" foods. These foods sometimes have the same amount of calories or more calories than the full fat versions. They also often have added sugar, starch, or salt, to make up for flavor that was removed with the fat.  Find a way of tracking calories that works for you. Get creative. Try different apps or programs if writing down calories does not work for you. What are some portion control tips?  Know how many calories are in a serving. This will help you know how many servings of a certain food you can have.  Use a measuring cup to measure serving sizes. You could also try weighing out portions on a kitchen scale. With time, you will be able to estimate serving sizes for some foods.  Take some time to put servings of different foods on your favorite plates, bowls, and cups so you know what a serving looks like.  Try not to eat straight from a bag or box. Doing this can lead to overeating. Put the amount you would like to eat in a cup or on a plate to make sure you are eating the right portion.  Use smaller plates, glasses, and bowls to prevent overeating.  Try not to multitask  (for example, watch TV or use your computer) while eating. If it is time to eat, sit down at a table and enjoy your food. This will help you to know when you are full. It will also help you to be aware of what you are eating and how much you are eating. What are tips for following this plan? Reading food labels  Check the calorie count compared to the serving size. The serving size may be smaller than what you are used to eating.  Check the source of the calories. Make sure the food you are eating is high in vitamins and protein and low in saturated and trans fats. Shopping  Read nutrition labels while you shop. This will help you make healthy decisions before you decide to purchase your food.  Make a grocery list and stick to it. Cooking  Try to cook your favorite foods in a healthier way. For example, try baking instead of frying.  Use low-fat dairy products. Meal planning  Use more fruits and vegetables. Half of your plate should be fruits and vegetables.  Include lean proteins like poultry and fish. How do I count calories when eating out?  Ask for smaller portion sizes.  Consider sharing an entree and sides instead of getting your own entree.  If you get your own entree, eat only half. Ask for  a box at the beginning of your meal and put the rest of your entree in it so you are not tempted to eat it.  If calories are listed on the menu, choose the lower calorie options.  Choose dishes that include vegetables, fruits, whole grains, low-fat dairy products, and lean protein.  Choose items that are boiled, broiled, grilled, or steamed. Stay away from items that are buttered, battered, fried, or served with cream sauce. Items labeled "crispy" are usually fried, unless stated otherwise.  Choose water, low-fat milk, unsweetened iced tea, or other drinks without added sugar. If you want an alcoholic beverage, choose a lower calorie option such as a glass of wine or light beer.  Ask  for dressings, sauces, and syrups on the side. These are usually high in calories, so you should limit the amount you eat.  If you want a salad, choose a garden salad and ask for grilled meats. Avoid extra toppings like bacon, cheese, or fried items. Ask for the dressing on the side, or ask for olive oil and vinegar or lemon to use as dressing.  Estimate how many servings of a food you are given. For example, a serving of cooked rice is  cup or about the size of half a baseball. Knowing serving sizes will help you be aware of how much food you are eating at restaurants. The list below tells you how big or small some common portion sizes are based on everyday objects: ? 1 oz--4 stacked dice. ? 3 oz--1 deck of cards. ? 1 tsp--1 die. ? 1 Tbsp-- a ping-pong ball. ? 2 Tbsp--1 ping-pong ball. ?  cup-- baseball. ? 1 cup--1 baseball. Summary  Calorie counting means keeping track of how many calories you eat and drink each day. If you eat fewer calories than your body needs, you should lose weight.  A healthy amount of weight to lose per week is usually 1-2 lb (0.5-0.9 kg). This usually means reducing your daily calorie intake by 500-750 calories.  The number of calories in a food can be found on a Nutrition Facts label. If a food does not have a Nutrition Facts label, try to look up the calories online or ask your dietitian for help.  Use your calories on foods and drinks that will fill you up, and not on foods and drinks that will leave you hungry.  Use smaller plates, glasses, and bowls to prevent overeating. This information is not intended to replace advice given to you by your health care provider. Make sure you discuss any questions you have with your health care provider. Document Revised: 11/17/2017 Document Reviewed: 01/29/2016 Elsevier Patient Education  2020 [employer].   Plan de alimentacin restringido en grasas y colesterol Fat and Cholesterol Restricted Eating Plan El  exceso de grasas y colesterol en la dieta puede causar problemas de [place]. Elegir los alimentos adecuados ayuda a [employer] niveles de grasas y [place]. Esto puede evitarle contraer ciertas enfermedades. El mdico puede recomendarle un plan de alimentacin que incluya lo siguiente:  Grasas totales: ______% o menos del total de caloras por da.  Grasas saturadas: ______% o menos del total de caloras por da.  Colesterol: menos de _________mg Karie Chimera.  Fibra: ______g Karie Chimera. Cules son algunos consejos para seguir este plan? Planificacin de las comidas  En las comidas, [place] su plato en cuatro partes iguales: ? Llene la mitad del plato con verduras y ensaladas de hojas verdes. ? Llene un cuarto del plato  con cereales integrales. ? Llene un cuarto del plato con alimentos con protenas con bajo contenido de grasas [employer]).  Coma pescado con alto contenido de grasas omega3 al [employer] veces por semana. Esas grasas se encuentran en la caballa, el atn, las sardinas y el salmn.  Coma alimentos con [address] contenido de [place], como cereales [place], frijoles, [place], [place], [place], guisantes y [place]. Consejos generales   Si necesita adelgazar, consulte a su mdico.  Evite lo siguiente: ? Alimentos con Engineer, mining. ? Comidas fritas. ? Alimentos con aceites parcialmente hidrogenados.  Limite el consumo de alcohol a no ms de por da si es mujer y no est [place], y por da si es hombre. Una medida equivale a 12oz de Financial controller, 5oz de vino o 1oz de bebidas alcohlicas de alta graduacin. Lea las etiquetas de los alimentos  Lea las etiquetas de los alimentos para conocer lo siguiente: ? Si contienen grasas trans. ? Si contienen aceites parcialmente hidrogenados. ? La cantidad de grasas saturadas (g) que contiene cada porcin. ? La cantidad de colesterol (mg) que contiene cada porcin. ? La cantidad de fibra (g) que contiene cada  porcin.  Elija alimentos con grasas saludables, tales como las siguientes: ? Grasas monoinsaturadas. ? Grasas poliinsaturadas. ? Grasas omega3.  Elija productos de cereal que tengan cereales integrales. Busque la palabra "integral" en Estate agent de la lista de ingredientes. Al cocinar  Emplee mtodos de coccin con poca cantidad de grasa. Por ejemplo, hornear, hervir, grillar y Transport planner.  Coma ms comidas caseras. Coma en restaurantes y bares con menos frecuencia.  Evite cocinar usando grasas saturadas, como [place], crema, aceite de [place], aceite de palmiste y aceite de [place]. Belen recomendados  Frutas  Frutas frescas, en conserva (en su jugo natural) o frutas congeladas. Verduras  Verduras frescas o congeladas (crudas, al vapor, asadas o grilladas). Ensaladas de hojas verdes. Granos  Cereales integrales, como panes, galletas, cereales y pastas de [address] o de trigo integral. Avena sin endulzar, trigo bulgur, cebada, quinua o arroz integral. Tortillas de harina de maz o trigo integral. Carnes y otros alimentos ricos en protenas  Carne de res molida (al 85% o ms magra), carne de res de animales alimentados con pastos o carne de res sin la grasa. Pollo o pavo sin piel. Carne de pollo o de [place]. Cerdo sin la grasa. Todos los pescados y frutos de mar. Claras de huevo. Porotos, guisantes o lentejas secos. Frutos secos o semillas sin sal. Frijoles enlatados sin sal. Mantequillas de frutos secos sin azcar ni aceite agregados. Lcteos  Productos lcteos descremados o semidescremados, como [employer] o al 1%, quesos reducidos en grasas o al 2%, queso cottage o ricota con bajo contenido de [place] o sin contenido de [place], o yogur natural descremado o semidescremado. Grasas y aceites  Margarina untable que no contenga grasas trans. [place] y condimentos para ensaladas livianos o reducidos en grasas. [place]. Aceites de oliva, canola, ssamo o crtamo. Es  posible que los productos que se enumeran ms [place] no constituyan una lista completa de los alimentos y las bebidas que puede tomar. Consulte a un nutricionista para obtener ms informacin. Alimentos que deben evitarse Frutas  Fruta enlatada en almbar espeso. Frutas con salsa de crema o mantequilla. Frutas cocidas en aceite. Verduras  Verduras cocinadas con salsas de queso, crema o mantequilla. Verduras fritas. Granos  Pan blanco. Pastas blancas. Arroz blanco. Pan de maz. Bagels, pasteles y croissants. Galletas saladas y colaciones que contengan grasas trans  y aceites hidrogenados. Carnes y otros alimentos ricos en protenas  Cortes de carne con alto contenido de [place]. Costillas, alas de pollo, tocineta, salchicha, mortadela, salame, chinchulines, tocino, perros calientes, salchichas alemanas y embutidos envasados. Hgado y otros rganos. Huevos enteros y yemas de [place]. Pollo y pavo con piel. Carne frita. Lcteos  [place]he entera o al 2%, crema, mezcla de [place] y crema, y queso crema. Quesos enteros. Yogur entero o endulzado. Quesos con toda su grasa. [place] no lcteas y coberturas batidas. Quesos procesados, quesos para untar y [place]. Bebidas  Alcohol. Bebidas endulzadas con azcar, como refrescos, limonada y bebidas frutales. Grasas y [address], [place] en barra, [place] de [place], [place], [place] clarificada o grasa de tocino. Aceites de coco, de palmiste y de palma. Dulces y postres  Jarabe de maz, azcares, miel y Radio broadcast assistant. Caramelos. Mermeladas y [place]. Doreen Beam. Cereales endulzados. Galletas, pasteles, bizcochuelos, donas, muffins y helado. Es posible que los productos que se enumeran ms [place] no constituyan una lista completa de los alimentos y las bebidas que Personnel officer. Consulte a un nutricionista para obtener ms informacin. Resumen  Elegir los alimentos adecuados ayuda a Pharmacologist normales los niveles de grasas y [place]. Esto puede evitarle  contraer ciertas enfermedades.  En las comidas, llene la mitad del plato con verduras y ensaladas de hojas verdes.  Coma alimentos con alto contenido de [place], como cereales [place], frijoles, [place], zanahorias, guisantes y [place].  Limite los alimentos con azcar agregada y grasas saturadas, el alcohol y las comidas fritas. Esta informacin no tiene Theme park manager el consejo del mdico. Asegrese de hacerle al mdico cualquier pregunta que tenga. Document Revised: 11/01/2017 Document Reviewed: 02/15/2017 Elsevier Patient Education  2020 [employer].

## 2019-07-22 ENCOUNTER — Other Ambulatory Visit: Payer: Self-pay | Admitting: Adult Health

## 2019-07-22 ENCOUNTER — Telehealth: Payer: Self-pay | Admitting: Adult Health

## 2019-07-22 MED ORDER — CLONAZEPAM 1 MG PO TBDP: 1.0000 mg | ORAL_TABLET | Freq: Two times a day (BID) | ORAL | 0 refills | Status: DC | PRN

## 2019-07-22 NOTE — Progress Notes (Signed)
Fill Date Drug Qty Days Prescriber Pharm Refill MgEq MgEq/Day  06/18/2019 Clonazepam 1 MG Odt 30.00 15 [address] [place]  0 60.00

## 2019-07-22 NOTE — Telephone Encounter (Signed)
Sent to pharmacy. Goal as discussed at office visit is to wean off klonopin as Lexapro dosage increased at last visit becomes more effective. Will not prescribe long term as will need psychiatric evaluation if need continues.

## 2019-07-22 NOTE — Telephone Encounter (Signed)
Medication: clonazePAM (KLONOPIN) 1 MG disintegrating tablet [[id number]]  ENDED  Has the patient contacted their pharmacy? Yes  (Agent: If no, request that the patient contact the pharmacy for the refill.) (Agent: If yes, when and what did the pharmacy advise?)  Preferred Pharmacy (with phone number or street name[phone] [address], [place]  Phone:  [phone] Fax:  [phone]     Agent: Please be advised that RX refills may take up to 3 business days. We ask that you follow-up with your pharmacy.

## 2019-07-23 LAB — COMPREHENSIVE METABOLIC PANEL WITH GFR
ALT: 13 IU/L (ref 0–44)
AST: 18 IU/L (ref 0–40)
Albumin/Globulin Ratio: 1.8 (ref 1.2–2.2)
Albumin: 4.6 g/dL (ref 4.0–5.0)
Alkaline Phosphatase: 66 IU/L (ref 39–117)
BUN/Creatinine Ratio: 18 (ref 9–20)
BUN: 21 mg/dL — ABNORMAL HIGH (ref 6–20)
Bilirubin Total: 0.5 mg/dL (ref 0.0–1.2)
CO2: 25 mmol/L (ref 20–29)
Calcium: 9.7 mg/dL (ref 8.7–10.2)
Chloride: 98 mmol/L (ref 96–106)
Creatinine, Ser: 1.17 mg/dL (ref 0.76–1.27)
GFR calc Af Amer: 93 mL/min/1.73
GFR calc non Af Amer: 80 mL/min/1.73
Globulin, Total: 2.5 g/dL (ref 1.5–4.5)
Glucose: 102 mg/dL — ABNORMAL HIGH (ref 65–99)
Potassium: 5.3 mmol/L — ABNORMAL HIGH (ref 3.5–5.2)
Sodium: 139 mmol/L (ref 134–144)
Total Protein: 7.1 g/dL (ref 6.0–8.5)

## 2019-07-23 LAB — CBC WITH DIFFERENTIAL/PLATELET
Basophils Absolute: 0 10*3/uL (ref 0.0–0.2)
Basos: 1 %
EOS (ABSOLUTE): 0.2 10*3/uL (ref 0.0–0.4)
Eos: 3 %
Hematocrit: 50.8 % (ref 37.5–51.0)
Hemoglobin: 17.4 g/dL (ref 13.0–17.7)
Immature Grans (Abs): 0 10*3/uL (ref 0.0–0.1)
Immature Granulocytes: 0 %
Lymphocytes Absolute: 1.6 10*3/uL (ref 0.7–3.1)
Lymphs: 26 %
MCH: 31.9 pg (ref 26.6–33.0)
MCHC: 34.3 g/dL (ref 31.5–35.7)
MCV: 93 fL (ref 79–97)
Monocytes Absolute: 0.5 10*3/uL (ref 0.1–0.9)
Monocytes: 8 %
Neutrophils Absolute: 3.8 10*3/uL (ref 1.4–7.0)
Neutrophils: 62 %
Platelets: 214 10*3/uL (ref 150–450)
RBC: 5.46 x10E6/uL (ref 4.14–5.80)
RDW: 12.6 % (ref 11.6–15.4)
WBC: 6.1 10*3/uL (ref 3.4–10.8)

## 2019-07-23 LAB — TESTOSTERONE: Testosterone: 414 ng/dL (ref 264–916)

## 2019-07-23 LAB — INTRINSIC FACTOR ANTIBODIES: Intrinsic Factor Abs, Serum: 1 [AU]/ml (ref 0.0–1.1)

## 2019-07-23 LAB — VITAMIN D 25 HYDROXY (VIT D DEFICIENCY, FRACTURES): Vit D, 25-Hydroxy: 31 ng/mL (ref 30.0–100.0)

## 2019-07-23 LAB — LIPID PANEL
Chol/HDL Ratio: 3.7 ratio (ref 0.0–5.0)
Cholesterol, Total: 254 mg/dL — ABNORMAL HIGH (ref 100–199)
HDL: 68 mg/dL (ref >39)
LDL Chol Calc (NIH): 172 mg/dL — ABNORMAL HIGH (ref 0–99)
Triglycerides: 83 mg/dL (ref 0–149)
VLDL Cholesterol Cal: 14 mg/dL (ref 5–40)

## 2019-07-23 LAB — PSA: Prostate Specific Ag, Serum: 1.1 ng/mL (ref 0.0–4.0)

## 2019-07-23 LAB — TSH: TSH: 1.1 u[IU]/mL (ref 0.450–4.500)

## 2019-07-23 LAB — VITAMIN B12: Vitamin B-12: 1329 pg/mL — ABNORMAL HIGH (ref 232–1245)

## 2019-07-24 NOTE — Progress Notes (Signed)
Add on hemoglobin A1C for elevated glucose.  Glucose mild elevation.  Potassium mild increased but ok. Any supplements/ vitamins ?  CBC within normal no signs of anemia or infection.  TSH within normal limits.  Total cholesterol and LDL elevated.  Discuss lifestyle modification with patient e.g. increase exercise, fiber, fruits, vegetables, lean meat, and omega 3/fish intake and decrease saturated fat.  If patient following strict diet and exercise program already please schedule follow up appointment with primary care physician Vitamin D is within normal limits.   B 12 is high. Discontinue supplement or go to every other day if taking any.  Testosterone is within normal range.   Intrinsic factor is normal so you can absorb b12.   PSA for prostate is within normal range.   Recheck cholesterol lab in 6 months.

## 2019-07-27 LAB — HEMOGLOBIN A1C
Est. average glucose Bld gHb Est-mCnc: 108 mg/dL
Hgb A1c MFr Bld: 5.4 % (ref 4.8–5.6)

## 2019-07-27 LAB — SPECIMEN STATUS REPORT

## 2019-07-29 ENCOUNTER — Telehealth: Payer: Self-pay

## 2019-07-29 NOTE — Telephone Encounter (Signed)
-----   Message from Berniece Pap, FNP sent at 07/29/2019  8:42 AM EDT ----- Hemoglobin A1C shows no prediabetes or diabetes.  PSA for prostate within normal limits.  Glucose mild elevation.  Potassium mild increased but ok. Any supplements/ vitamins ?  CBC within normal no signs of anemia or infection.  TSH within normal limits.  Total cholesterol and LDL elevated. Discuss lifestyle modification with patient e.g. increase exercise, fiber, fruits, vegetables, lean meat, and omega 3/fish intake and decrease saturated fat. If patient following strict diet and exercise program already please schedule follow up appointment with primary care physician Vitamin D is within normal limits.   B 12 is high. Discontinue supplement or go to every other day if taking any.  Testosterone is within normal range.   Intrinsic factor is normal so you can absorb b12.   PSA for prostate is within normal range.   Recheck cholesterol lab in 6 months.

## 2019-07-29 NOTE — Telephone Encounter (Signed)
Pt given results per Marcelino Duster Flinchum; "Hemoglobin A1C shows no prediabetes or diabetes. PSA for prostate within normal limits.  Glucose mild elevation. Potassium mild increased but ok. Any supplements/ vitamins ?  CBC within normal no signs of anemia or infection. TSH within normal limits. Total cholesterol and LDL elevated. Discuss lifestyle modification with patient e.g. increase exercise, fiber, fruits, vegetables, lean meat, and omega 3/fish intake and decrease saturated fat. If patient following strict diet and exercise program already please schedule follow up appointment with primary care physician. Vitamin D is within normal limits. B 12 is high. Discontinue supplement or go to every other day if taking any. Testosterone is within normal range.  Intrinsic factor is normal so you can absorb b12.  PSA for prostate is within normal range.  Recheck cholesterol lab in 6 months."; the pt  says he is taking vit B12 daily, and MVI daily; he verbalizes understanding; will route to office for notification.

## 2019-07-29 NOTE — Progress Notes (Signed)
Hemoglobin A1C shows no prediabetes or diabetes.  PSA for prostate within normal limits.  Glucose mild elevation.  Potassium mild increased but ok. Any supplements/ vitamins ?  CBC within normal no signs of anemia or infection.  TSH within normal limits.  Total cholesterol and LDL elevated. Discuss lifestyle modification with patient e.g. increase exercise, fiber, fruits, vegetables, lean meat, and omega 3/fish intake and decrease saturated fat. If patient following strict diet and exercise program already please schedule follow up appointment with primary care physician Vitamin D is within normal limits.   B 12 is high. Discontinue supplement or go to every other day if taking any.  Testosterone is within normal range.   Intrinsic factor is normal so you can absorb b12.   PSA for prostate is within normal range.   Recheck cholesterol lab in 6 months.

## 2019-07-29 NOTE — Telephone Encounter (Signed)
Called patient and  No answer,  left voicemail for patient to return call. If patient calls back okay for PEC to advise patient of message.

## 2019-09-02 ENCOUNTER — Ambulatory Visit: Payer: Self-pay | Admitting: Adult Health

## 2019-09-12 ENCOUNTER — Other Ambulatory Visit: Payer: Self-pay | Admitting: Adult Health

## 2019-09-12 DIAGNOSIS — F411 Generalized anxiety disorder: Secondary | ICD-10-CM

## 2019-09-12 MED ORDER — ESCITALOPRAM OXALATE 10 MG PO TABS: 10.0000 mg | ORAL_TABLET | Freq: Every day | ORAL | 0 refills | Status: DC

## 2019-09-12 MED ORDER — ESCITALOPRAM OXALATE 5 MG PO TABS: 5.0000 mg | ORAL_TABLET | Freq: Every day | ORAL | 0 refills | Status: DC

## 2019-09-12 NOTE — Telephone Encounter (Signed)
Requested medication (s) are due for refill today: Yes  Requested medication (s) are on the active medication list: Yes  Last refill:  07/22/19  Future visit scheduled: No  Notes to clinic:  Historical provider.    Requested Prescriptions  Pending Prescriptions Disp Refills   escitalopram (LEXAPRO) 10 MG tablet      Sig: Take 1 tablet (10 mg total) by mouth daily.      Psychiatry:  Antidepressants - SSRI Passed - 09/12/2019  8:34 AM      Passed - Completed PHQ-2 or PHQ-9 in the last 360 days.      Passed - Valid encounter within last 6 months    Recent Outpatient Visits           1 month ago Encounter for routine adult health examination without abnormal findings   [facility] Flinchum, Eula Fried, FNP       Future Appointments             In 1 month Bobbitt, Heywood Iles, MD [facility]

## 2019-09-12 NOTE — Telephone Encounter (Signed)
Attempted to contact patient, no answer left a voicemail. Okay for PEC to advise patient and schedule follow up appointment.   

## 2019-09-12 NOTE — Telephone Encounter (Signed)
Refilled Lexapro. He is taking 15mg  total daily now. Canceled follow up appointment - he needs to reschedule.

## 2019-09-12 NOTE — Telephone Encounter (Signed)
Please review.  It looks like his last office visit you increase is Lexapro to 15mg . He had a follow up visit scheduled for 09/13/2019 but it looks like it was cancelled.   Thanks,   -11/14/2019

## 2019-09-12 NOTE — Telephone Encounter (Signed)
PT is requesting refill on Lexapro 10 mg tablets. [facility] [address] [place]  Pt stated that he had not reached out to pharmacy yet but he's going to PT # [phone]

## 2019-09-13 ENCOUNTER — Ambulatory Visit: Payer: Self-pay | Admitting: Adult Health

## 2019-09-27 ENCOUNTER — Telehealth: Payer: Self-pay | Admitting: Adult Health

## 2019-09-27 NOTE — Telephone Encounter (Signed)
Ok to approve, advise patient to keep appointment - he is taking 15 mg Lexapro po daily total.

## 2019-09-27 NOTE — Telephone Encounter (Signed)
[facility] following medications:  escitalopram (LEXAPRO) 5 MG tablet  escitalopram (LEXAPRO) 10 MG tablet   90 day supply  Please advise.

## 2019-09-30 NOTE — Telephone Encounter (Signed)
Question for ya since these are two prescriptions to fill would it be better to just prescribe 5mg  TID so he wouldn't have to pay two copays for prescriptions or do you want to keep as is? KW

## 2019-09-30 NOTE — Telephone Encounter (Signed)
Left message for patient to call back to get further clarification. KW

## 2019-09-30 NOTE — Telephone Encounter (Signed)
Ok to do, he initially had 10 mg tablets left and wanted to use but ok to change if those are gone and he would like Lexapro 5 mg take 3 tablets by mouth once daily. # 270 tablets for a 90 day supply/ Advise needs to keep follow up's.

## 2019-10-09 NOTE — Telephone Encounter (Signed)
Attempted to reach out to patient with no response, okay to close encounter? KW

## 2019-10-09 NOTE — Telephone Encounter (Signed)
Ok thanks 

## 2019-10-14 DIAGNOSIS — J3089 Other allergic rhinitis: Secondary | ICD-10-CM | POA: Insufficient documentation

## 2019-10-14 DIAGNOSIS — Z91018 Allergy to other foods: Secondary | ICD-10-CM | POA: Insufficient documentation

## 2019-10-14 NOTE — Progress Notes (Deleted)
Follow Up Note  RE: Lawrence Andrews MRN: [id number] DOB: 1985-10-14 Date of Office Visit: 10/15/2019  Referring provider: Kirk Ruths, MD Primary care provider: Doreen Beam, FNP  Chief Complaint: No chief complaint on file.  History of Present Illness: I had the pleasure of seeing Lawrence Andrews for a follow up visit at the [facility] on 10/14/2019. He is a 34 y.o. male, who is being followed for urticaria with angioedema, allergic reaction and epistaxis. His previous allergy office visit was on 04/16/2019 with Dr. Verlin Fester. Today is a regular follow up visit.  Please inform patient that labs reveal alpha-gal hypersensitivity. The patient is to meticulously avoid all mammalian meat (beef, pork, lamb, venison, bison, etc).  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 He should certainly continue careful avoidance and have access to epinephrine autoinjectors. He is a candidate for fire ant immunotherapy if he is interested in proceeding with that course of action. Finally, there was slight elevation to H. pylori IgA. This may be false positive, however we could perform H. pylori urea breath test to confirm.  Pt calling in today, states that he is feeling light headed, weak and dizzy, has flushed skin.  Pt states that after taking the medication xyzal, he felt like this.  He states that as of now he hasn't eaten, but is trying to eat something now to see how he feels.   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   Occasionally, the patient with alpha gal hypersensitivity will have reactions to gelatin products and/or cows milk/dairy products. Therefore, carefully avoid all of these things-fire ants, mammalian meats, gelatin products, and cow's milk/dairy products. If the symptoms persist or progress despite careful avoidance of these foods then seek further evaluation from the  primary care physician as this is not allergy related.  Thanks.    Recurrent urticaria Unclear etiology. Skin tests to select food allergens were negative today.  Environmental skin test revealed reactivity to pollen, however the symptoms have occurred throughout the winter in the absence of pollen exposure.  NSAIDs and emotional stress commonly exacerbate urticaria but are not the underlying etiology in this case. Physical urticarias are negative by history (i.e. pressure-induced, temperature, vibration, solar, etc.). History and lesions are not consistent with urticaria pigmentosa so I am not suspicious for mastocytosis. There are no concomitant symptoms concerning for anaphylaxis or constitutional symptoms worrisome for an underlying malignancy. We will rule out other potential etiologies with labs. For symptom relief, patient is to take oral antihistamines as directed.  The following labs have been ordered: FCeRI antibody, anti-thyroglobulin antibody, thyroid peroxidase antibody, C4 level, tryptase, H. pylori serology, CBC, CMP, ESR, ANA, and alpha-gal panel.  The patient will be called with further recommendations after lab results have returned.  Instructions have been discussed and provided for H1/H2 receptor blockade with titration to find lowest effective dose.  A prescription has been provided for levocetirizine (Xyzal), 5 mg daily as needed.  A prescription has been provided for famotidine (Pepcid), 20 mg twice daily as needed.  Should there be a significant increase or change in symptoms, a journal is to be kept recording any foods eaten, beverages consumed, medications taken within a 6 hour period prior to the onset of symptoms, as well as record activities being performed, and environmental conditions. For any symptoms concerning for anaphylaxis, epinephrine is to be administered and 911 is to be  called immediately.  A prescription has been provided for epinephrine 0.3 mg  autoinjector (AuviQ) 2 pack along with instructions for its proper administration.  Angioedema Associated angioedema occurs in up to 50% of patients with chronic urticaria.  Treatment/diagnostic plan as outlined above.  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  It is unclear if he had an IgE mediated allergy or if this year quantity of the venom induced the anaphylaxis.  Laboratory order form has been provided for serum specific IgE against fire ant venom.  For now, continue careful avoidance of fire ants and have access to epinephrine autoinjector.  Epistaxis  Proper technique for stanching epistaxis has been discussed and demonstrated.  Nasal saline spray and/or nasal saline gel is recommended to moisturize nasal mucosa.  The use of a cool-mist humidifier during the night is recommended.  During epistaxis, if needed, oxymetazoline (Afrin) nasal spray may be applied to a cotton ball to help stanch the blood flow.  If this problem persists or progresses, otolaryngology evaluation may be warranted.   Assessment and Plan: Lawrence Andrews is a 34 y.o. male with: No problem-specific Assessment & Plan notes found for this encounter.  No follow-ups on file.  No orders of the defined types were placed in this encounter.  Lab Orders  No laboratory test(s) ordered today    Diagnostics: Spirometry:  Tracings reviewed. His effort: {Blank single:19197::"Good reproducible efforts.","It was hard to get consistent efforts and there is a question as to whether this reflects a maximal maneuver.","Poor effort, data can not be interpreted."} FVC: ***L FEV1: ***L, ***% predicted FEV1/FVC ratio: ***% Interpretation: {Blank single:19197::"Spirometry consistent with mild obstructive disease","Spirometry consistent with moderate obstructive disease","Spirometry consistent with severe obstructive disease","Spirometry consistent with possible restrictive  disease","Spirometry consistent with mixed obstructive and restrictive disease","Spirometry uninterpretable due to technique","Spirometry consistent with normal pattern","No overt abnormalities noted given today's efforts"}.  Please see scanned spirometry results for details.  Skin Testing: {Blank single:19197::"Select foods","Environmental allergy panel","Environmental allergy panel and select foods","Food allergy panel","None","Deferred due to recent antihistamines use"}. Positive test to: ***. Negative test to: ***.  Results discussed with patient/family.   Medication List:  Current Outpatient Medications  Medication Sig Dispense Refill  . cetirizine (ZYRTEC) 10 MG tablet Take 10 mg by mouth daily.    . clonazePAM (KLONOPIN) 1 MG disintegrating tablet Take 1 tablet (1 mg total) by mouth 2 (two) times daily as needed (anxiety plan to wean as Lexapro dose was increased). 30 tablet 0  . escitalopram (LEXAPRO) 10 MG tablet Take 1 tablet (10 mg total) by mouth daily. Taking with 5 mg tablet for total of 15 mg po qd. 60 tablet 0  . escitalopram (LEXAPRO) 5 MG tablet Take 1 tablet (5 mg total) by mouth daily. 60 tablet 0  . famotidine (PEPCID) 20 MG tablet Take 1 tablet (20 mg total) by mouth 2 (two) times daily. 60 tablet 5  . fluconazole (DIFLUCAN) 150 MG tablet Take 1 tablet (150 mg total) by mouth as directed. Take one tablet by mouth on day 1. May repeat dose of one tablet by mouth on day four. Avoid alcohol for 7 days. 2 tablet 0  . levocetirizine (XYZAL) 5 MG tablet Take 1 tablet (5 mg total) by mouth every evening. 30 tablet 5  . pantoprazole (PROTONIX) 40 MG tablet Take 40 mg by mouth daily.     No current facility-administered medications for this visit.   Allergies: Allergies  Allergen Reactions  . Alpha-Gal  Red meat  . Other     Fire ants and pollen   I reviewed his past medical history, social history, family history, and environmental history and no significant changes  have been reported from his previous visit.  Review of Systems  Constitutional: Negative for appetite change, chills, fever and unexpected weight change.  HENT: Negative for congestion and rhinorrhea.   Eyes: Negative for itching.  Respiratory: Negative for cough, chest tightness, shortness of breath and wheezing.   Gastrointestinal: Negative for abdominal pain.  Skin: Negative for rash.  Neurological: Negative for headaches.   Objective: There were no vitals taken for this visit. There is no height or weight on file to calculate BMI. Physical Exam Vitals and nursing note reviewed.  Constitutional:      Appearance: Normal appearance. He is well-developed.  HENT:     Head: Normocephalic and atraumatic.     Right Ear: External ear normal.     Left Ear: External ear normal.     Nose: Nose normal.     Mouth/Throat:     Mouth: Mucous membranes are moist.     Pharynx: Oropharynx is clear.  Eyes:     Conjunctiva/sclera: Conjunctivae normal.  Cardiovascular:     Rate and Rhythm: Normal rate and regular rhythm.     Heart sounds: Normal heart sounds. No murmur heard.   Pulmonary:     Effort: Pulmonary effort is normal.     Breath sounds: Normal breath sounds. No wheezing, rhonchi or rales.  Musculoskeletal:     Cervical back: Neck supple.  Skin:    General: Skin is warm.     Findings: No rash.  Neurological:     Mental Status: He is alert and oriented to person, place, and time.  Psychiatric:        Behavior: Behavior normal.    Previous notes and tests were reviewed. The plan was reviewed with the patient/family, and all questions/concerned were addressed.  It was my pleasure to see Javiel today and participate in his care. Please feel free to contact me with any questions or concerns.  Sincerely,  Rexene Alberts, DO Allergy & Immunology  [facility] office: [phone] [facility] office: [place] office:  [phone]

## 2019-10-15 ENCOUNTER — Ambulatory Visit: Payer: No Typology Code available for payment source | Admitting: Allergy and Immunology

## 2019-10-15 ENCOUNTER — Ambulatory Visit: Payer: Self-pay | Admitting: Allergy

## 2019-10-15 DIAGNOSIS — J3089 Other allergic rhinitis: Secondary | ICD-10-CM

## 2019-10-15 DIAGNOSIS — Z91018 Allergy to other foods: Secondary | ICD-10-CM

## 2019-10-15 DIAGNOSIS — T7840XD Allergy, unspecified, subsequent encounter: Secondary | ICD-10-CM

## 2019-11-08 ENCOUNTER — Other Ambulatory Visit: Payer: Self-pay | Admitting: Adult Health

## 2019-11-08 MED ORDER — ESCITALOPRAM OXALATE 10 MG PO TABS: 10.0000 mg | ORAL_TABLET | Freq: Every day | ORAL | 0 refills | Status: DC

## 2019-11-08 NOTE — Telephone Encounter (Signed)
Medication Refill - Medication:  escitalopram (LEXAPRO) 10 MG tablet [[id number]]     Preferred Pharmacy (with phone number or street name):  [facility] [phone] [facility] [place] - [address]  [address] [place] [address]  Phone: [phone] Fax: [phone]  Hours: Not open 24 hours     Agent: Please be advised that RX refills may take up to 3 business days. We ask that you follow-up with your pharmacy.

## 2019-11-18 ENCOUNTER — Other Ambulatory Visit: Payer: Self-pay

## 2019-11-18 ENCOUNTER — Ambulatory Visit
Admission: EM | Admit: 2019-11-18 | Discharge: 2019-11-18 | Disposition: A | Discharge status: Home / Self Care | Payer: Managed Care, Other (non HMO) | Attending: Emergency Medicine | Admitting: Emergency Medicine

## 2019-11-18 DIAGNOSIS — L237 Allergic contact dermatitis due to plants, except food: Secondary | ICD-10-CM

## 2019-11-18 MED ORDER — PREDNISONE 10 MG (21) PO TBPK: ORAL_TABLET | ORAL | 0 refills | Status: DC

## 2019-11-18 MED ORDER — CETIRIZINE HCL 10 MG PO TABS: 10.0000 mg | ORAL_TABLET | Freq: Every day | ORAL | 0 refills | Status: DC

## 2019-11-18 MED ORDER — DEXAMETHASONE SODIUM PHOSPHATE 10 MG/ML IJ SOLN
10.0000 mg | Freq: Once | INTRAMUSCULAR | Status: AC
  Administered 2019-11-18: 10 mg via INTRAMUSCULAR

## 2019-11-18 MED ORDER — TRIAMCINOLONE ACETONIDE 0.1 % EX CREA: 1.0000 "application " | TOPICAL_CREAM | Freq: Two times a day (BID) | CUTANEOUS | 0 refills | Status: DC

## 2019-11-18 NOTE — Discharge Instructions (Addendum)
Prescribed prednisone and zyrtec  May use triamcinolone cream for symptomatic relief Take as prescribed and to completion Limit hot shower and baths, or bathe with warm water.   Moisturize skin daily Follow up with PCP if symptoms persists Return or go to the ER if you have any new or worsening symptoms

## 2019-11-18 NOTE — ED Provider Notes (Signed)
[facility]   [id number] 11/18/19 Arrival Time: 1208   Chief Complaint  Patient presents with   Rash     SUBJECTIVE: History from: patient.  Lawrence Andrews is a 34 y.o. male who presented to the urgent care for complaint of rash for the past 3 days.  Denies a precipitating event.  Localized rash to bilateral lower extremities, abdomen, back and upper arm.  Described the rash as red and itchy.  Has not tried any medication.  Report previous history of present dermatitis.  Denies fever, chills, nausea, vomiting, headache, dizziness, weakness, fatigue, rash, or abdominal pain.   ROS: As per HPI.  All other pertinent ROS negative.     Past Medical History:  Diagnosis Date   Allergy    Allergy to alpha-gal    Anxiety    Depression    Hypertension    Past Surgical History:  Procedure Laterality Date   NO PAST SURGERIES     Allergies  Allergen Reactions   Alpha-Gal     Red meat   Other     Fire ants and pollen   No current facility-administered medications on file prior to encounter.   Current Outpatient Medications on File Prior to Encounter  Medication Sig Dispense Refill   clonazePAM (KLONOPIN) 1 MG disintegrating tablet Take 1 tablet (1 mg total) by mouth 2 (two) times daily as needed (anxiety plan to wean as Lexapro dose was increased). 30 tablet 0   escitalopram (LEXAPRO) 10 MG tablet Take 1 tablet (10 mg total) by mouth daily. Taking with 5 mg tablet for total of 15 mg po qd. 60 tablet 0   escitalopram (LEXAPRO) 5 MG tablet Take 1 tablet (5 mg total) by mouth daily. 60 tablet 0   famotidine (PEPCID) 20 MG tablet Take 1 tablet (20 mg total) by mouth 2 (two) times daily. 60 tablet 5   fluconazole (DIFLUCAN) 150 MG tablet Take 1 tablet (150 mg total) by mouth as directed. Take one tablet by mouth on day 1. May repeat dose of one tablet by mouth on day four. Avoid alcohol for 7 days. 2 tablet 0   levocetirizine (XYZAL) 5 MG tablet Take 1 tablet (5  mg total) by mouth every evening. 30 tablet 5   pantoprazole (PROTONIX) 40 MG tablet Take 40 mg by mouth daily.     Social History   Socioeconomic History   Marital status: Single    Spouse name: Not on file   Number of children: Not on file   Years of education: Not on file   Highest education level: Not on file  Occupational History   Not on file  Tobacco Use   Smoking status: Never Smoker   Smokeless tobacco: Current User    Types: Chew  Vaping Use   Vaping Use: Never used  Substance and Sexual Activity   Alcohol use: Yes    Comment: daily   Drug use: No   Sexual activity: Not on file  Other Topics Concern   Not on file  Social History Narrative   Not on file   Social Determinants of Health   Financial Resource Strain:    Difficulty of Paying Living Expenses: Not on file  Food Insecurity:    Worried About Running Out of Food in the Last Year: Not on file   Ran Out of Food in the Last Year: Not on file  Transportation Needs:    Lack of Transportation (Medical): Not on file   Lack of  Transportation (Non-Medical): Not on file  Physical Activity:    Days of Exercise per Week: Not on file   Minutes of Exercise per Session: Not on file  Stress:    Feeling of Stress : Not on file  Social Connections:    Frequency of Communication with Friends and Family: Not on file   Frequency of Social Gatherings with Friends and Family: Not on file   Attends Religious Services: Not on file   Active Member of Clubs or Organizations: Not on file   Attends Banker Meetings: Not on file   Marital Status: Not on file  Intimate Partner Violence:    Fear of Current or Ex-Partner: Not on file   Emotionally Abused: Not on file   Physically Abused: Not on file   Sexually Abused: Not on file   Family History  Problem Relation Age of Onset   Hypertension Mother    Healthy Father    Stroke Maternal Grandfather     OBJECTIVE:  Vitals:     11/18/19 1329  BP: 130/73  Pulse: 62  Resp: 20  Temp: 97.9 F (36.6 C)  TempSrc: Oral  SpO2: 95%     Physical Exam Vitals and nursing note reviewed.  Constitutional:      General: He is not in acute distress.    Appearance: Normal appearance. He is normal weight. He is not ill-appearing, toxic-appearing or diaphoretic.  Cardiovascular:     Rate and Rhythm: Normal rate and regular rhythm.     Pulses: Normal pulses.     Heart sounds: Normal heart sounds. No murmur heard.  No friction rub. No gallop.   Pulmonary:     Effort: Pulmonary effort is normal. No respiratory distress.     Breath sounds: Normal breath sounds. No stridor. No wheezing, rhonchi or rales.  Chest:     Chest wall: No tenderness.  Skin:    General: Skin is warm.     Findings: Rash present. Rash is macular and vesicular.  Neurological:     Mental Status: He is alert and oriented to person, place, and time.      LABS:  No results found for this or any previous visit (from the past 24 hour(s)).   ASSESSMENT & PLAN:  1. Poison oak dermatitis     Meds ordered this encounter  Medications   dexamethasone (DECADRON) injection 10 mg   predniSONE (STERAPRED UNI-PAK 21 TAB) 10 MG (21) TBPK tablet    Sig: Take 6 tabs by mouth daily  for 1 days, then 5 tabs for 1 days, then 4 tabs for 1 days, then 3 tabs for 1 days, 2 tabs for 1 days, then 1 tab by mouth daily for 1 days    Dispense:  21 tablet    Refill:  0   cetirizine (ZYRTEC ALLERGY) 10 MG tablet    Sig: Take 1 tablet (10 mg total) by mouth daily.    Dispense:  30 tablet    Refill:  0   triamcinolone cream (KENALOG) 0.1 %    Sig: Apply 1 application topically 2 (two) times daily.    Dispense:  30 g    Refill:  0   Discharge instructions  Prescribed prednisone and zyrtec  May use triamcinolone cream for symptomatic relief Take as prescribed and to completion Limit hot shower and baths, or bathe with warm water.   Moisturize skin  daily Follow up with PCP if symptoms persists Return or go to the ER if  you have any new or worsening symptoms   Reviewed expectations re: course of current medical issues. Questions answered. Outlined signs and symptoms indicating need for more acute intervention. Patient verbalized understanding. After Visit Summary given.     Note: This document was prepared using Dragon voice recognition software and may include unintentional dictation errors.     Durward Parcel, FNP 11/18/19 1404

## 2019-11-18 NOTE — ED Triage Notes (Signed)
Pt presents with insect bites on both legs that he believes to be from chiggers X 3 days ago.

## 2019-11-26 ENCOUNTER — Ambulatory Visit (INDEPENDENT_AMBULATORY_CARE_PROVIDER_SITE_OTHER): Payer: Managed Care, Other (non HMO) | Admitting: Physician Assistant

## 2019-11-26 ENCOUNTER — Other Ambulatory Visit: Payer: Self-pay

## 2019-11-26 ENCOUNTER — Ambulatory Visit: Payer: Self-pay | Admitting: Adult Health

## 2019-11-26 ENCOUNTER — Encounter: Payer: Self-pay | Admitting: Physician Assistant

## 2019-11-26 VITALS — BP 149/94 | HR 88 | Temp 98.5°F | Wt 250.6 lb

## 2019-11-26 DIAGNOSIS — F419 Anxiety disorder, unspecified: Secondary | ICD-10-CM

## 2019-11-26 DIAGNOSIS — M792 Neuralgia and neuritis, unspecified: Secondary | ICD-10-CM | POA: Diagnosis not present

## 2019-11-26 DIAGNOSIS — F331 Major depressive disorder, recurrent, moderate: Secondary | ICD-10-CM

## 2019-11-26 NOTE — Progress Notes (Signed)
Established patient visit   Patient: Lawrence Andrews   DOB: [birth year]   33 y.o. Male  MRN: [id number] Visit Date: 11/26/2019  Today's healthcare provider: [name], PA-C   Chief Complaint  Patient presents with  . Anxiety  I,[name],acting as a scribe for [name], PA-C.,have documented all relevant documentation on the behalf of [name], PA-C,as directed by  [name], PA-C while in the presence of [name], PA-C.  Subjective    HPI  Anxiety, Follow-up  He was last seen for anxiety 4 months ago. Changes made at last visit include patient reports he stopped taking the Lexapro 10 mg that was started last visit. He was previously on effexor from another provider which he reports also was not helpful. He reports he previously was on klonopin as well but is not currently on this. He is not currently talking to a therapist.    He reports poor compliance with treatment. He reports fair to poor tolerance of treatment. He is not having side effects.   He feels his anxiety is moderate and Unchanged since last visit.  Symptoms: No chest pain Yes difficulty concentrating  No dizziness No fatigue  Yes feelings of losing control No insomnia  Yes irritable No palpitations  No panic attacks Yes racing thoughts  No shortness of breath No sweating  No tremors/shakes    GAD-7 Results GAD-7 Generalized Anxiety Disorder Screening Tool 11/26/2019 07/16/2019  1. Feeling Nervous, Anxious, or on Edge 2 3  2. Not Being Able to Stop or Control Worrying 3 2  3. Worrying Too Much About Different Things 2 3  4. Trouble Relaxing 3 2  5. Being So Restless it's Hard To Sit Still 2 1  6. Becoming Easily Annoyed or Irritable 2 1  7. Feeling Afraid As If Something Awful Might Happen 2 1  Total GAD-7 Score 16 13  Difficulty At Work, Home, or Getting  Along With Others? Not difficult at all Somewhat difficult    PHQ-9 Scores PHQ9 SCORE ONLY  11/26/2019 07/16/2019  PHQ-9 Total Score 8 4   BP Readings from Last 3 Encounters:  11/26/19 (!) 149/94  11/18/19 130/73  07/16/19 122/82     ---------------------------------------------------------------------------------------------------      Medications: Outpatient Medications Prior to Visit  Medication Sig  . cetirizine (ZYRTEC ALLERGY) 10 MG tablet Take 1 tablet (10 mg total) by mouth daily.  . famotidine (PEPCID) 20 MG tablet Take 1 tablet (20 mg total) by mouth 2 (two) times daily.  . fluconazole (DIFLUCAN) 150 MG tablet Take 1 tablet (150 mg total) by mouth as directed. Take one tablet by mouth on day 1. May repeat dose of one tablet by mouth on day four. Avoid alcohol for 7 days.  Marland Kitchen levocetirizine (XYZAL) 5 MG tablet Take 1 tablet (5 mg total) by mouth every evening.  . pantoprazole (PROTONIX) 40 MG tablet Take 40 mg by mouth daily.  . predniSONE (STERAPRED UNI-PAK 21 TAB) 10 MG (21) TBPK tablet Take 6 tabs by mouth daily  for 1 days, then 5 tabs for 1 days, then 4 tabs for 1 days, then 3 tabs for 1 days, 2 tabs for 1 days, then 1 tab by mouth daily for 1 days  . triamcinolone cream (KENALOG) 0.1 % Apply 1 application topically 2 (two) times daily.  . clonazePAM (KLONOPIN) 1 MG disintegrating tablet Take 1 tablet (1 mg total) by mouth 2 (two) times daily as needed (  anxiety plan to wean as Lexapro dose was increased).  Marland Kitchen escitalopram (LEXAPRO) 10 MG tablet Take 1 tablet (10 mg total) by mouth daily. Taking with 5 mg tablet for total of 15 mg po qd. (Patient not taking: Reported on 11/26/2019)  . escitalopram (LEXAPRO) 5 MG tablet Take 1 tablet (5 mg total) by mouth daily. (Patient not taking: Reported on 11/26/2019)   No facility-administered medications prior to visit.    Review of Systems  Constitutional: Positive for fatigue.  Respiratory: Negative.   Neurological: Positive for weakness.      Objective    BP (!) 149/94 (BP Location: Right Arm, Patient Position:  Sitting, Cuff Size: Normal)   Pulse 88   Temp 98.5 F (36.9 C) (Oral)   Wt 250 lb 9.6 oz (113.7 kg)   SpO2 96%   BMI 32.18 kg/m    Physical Exam Constitutional:      Appearance: Normal appearance.  Cardiovascular:     Rate and Rhythm: Normal rate and regular rhythm.     Heart sounds: Normal heart sounds.  Pulmonary:     Effort: Pulmonary effort is normal.     Breath sounds: Normal breath sounds.  Skin:    General: Skin is warm and dry.  Neurological:     Mental Status: He is alert and oriented to person, place, and time. Mental status is at baseline.  Psychiatric:        Mood and Affect: Mood normal.        Behavior: Behavior normal.       No results found for any visits on 11/26/19.  Assessment & Plan    1. Anxiety  Patient wishes to see a psychiatrist for further medication management.   - Ambulatory referral to Psychiatry - Ambulatory referral to Psychology  2. Moderate episode of recurrent major depressive disorder (HCC)  I spent 20 minutes dedicated to the care of this patient on the date of this encounter to include pre-visit review of records, face-to-face time with the patient discussing depression and anxiety, and post visit ordering of testing.    No follow-ups on file.      ITrey Sailors, PA-C, have reviewed all documentation for this visit. The documentation on 11/26/19 for the exam, diagnosis, procedures, and orders are all accurate and complete.  The entirety of the information documented in the History of Present Illness, Review of Systems and Physical Exam were personally obtained by me. Portions of this information were initially documented by [facility] and reviewed by me for thoroughness and accuracy.     Maryella Shivers  [facility] [phone] (phone) [phone] (fax)  [facility]

## 2019-12-10 ENCOUNTER — Telehealth: Payer: Self-pay | Admitting: Physician Assistant

## 2019-12-10 ENCOUNTER — Telehealth: Payer: Self-pay

## 2019-12-10 ENCOUNTER — Other Ambulatory Visit: Payer: Self-pay | Admitting: Adult Health

## 2019-12-10 NOTE — Telephone Encounter (Signed)
Copied from CRM [id number]. Topic: General - Other >> Dec 10, 2019  4:11 PM Dalphine Handing A wrote: Patient returning Sarben call and is requesting a callback

## 2019-12-10 NOTE — Telephone Encounter (Signed)
Can we let patient know I received this communication on his psychiatry referral today:  As of 12-02-19, per Dr. Elna Breslow she currently is not seeing any new patients  Please have patient check with their insurance company to find out who is in network.  Try the follow: [facility] [id number] Evlyn Clines [phone] [facility] [phone]  He should be alerted of the above resources. We can also try referring to [facility]

## 2019-12-10 NOTE — Telephone Encounter (Signed)
[id number] 

## 2019-12-12 NOTE — Telephone Encounter (Signed)
Please see previous telephone encounter. KW 

## 2019-12-13 NOTE — Telephone Encounter (Signed)
Patient was advised and states that he will call his insurance company and give the office a call back with which psychiatry he would like a referral to.

## 2019-12-19 ENCOUNTER — Ambulatory Visit: Payer: 59 | Admitting: Psychology

## 2019-12-24 ENCOUNTER — Other Ambulatory Visit: Payer: Self-pay

## 2019-12-24 ENCOUNTER — Ambulatory Visit (INDEPENDENT_AMBULATORY_CARE_PROVIDER_SITE_OTHER): Payer: Managed Care, Other (non HMO) | Admitting: Adult Health

## 2019-12-24 ENCOUNTER — Encounter: Payer: Self-pay | Admitting: Adult Health

## 2019-12-24 VITALS — BP 141/91 | HR 81 | Temp 98.5°F | Resp 16 | Wt 260.0 lb

## 2019-12-24 DIAGNOSIS — Z113 Encounter for screening for infections with a predominantly sexual mode of transmission: Secondary | ICD-10-CM | POA: Diagnosis not present

## 2019-12-24 DIAGNOSIS — F331 Major depressive disorder, recurrent, moderate: Secondary | ICD-10-CM | POA: Diagnosis not present

## 2019-12-24 DIAGNOSIS — R5383 Other fatigue: Secondary | ICD-10-CM

## 2019-12-24 DIAGNOSIS — F419 Anxiety disorder, unspecified: Secondary | ICD-10-CM

## 2019-12-24 DIAGNOSIS — M62838 Other muscle spasm: Secondary | ICD-10-CM | POA: Insufficient documentation

## 2019-12-24 MED ORDER — BUPROPION HCL ER (XL) 300 MG PO TB24: 300.0000 mg | ORAL_TABLET | Freq: Every day | ORAL | 0 refills | Status: DC

## 2019-12-24 MED ORDER — CYCLOBENZAPRINE HCL 10 MG PO TABS: 10.0000 mg | ORAL_TABLET | Freq: Every evening | ORAL | 0 refills | Status: DC | PRN

## 2019-12-24 NOTE — Patient Instructions (Addendum)
Persistent Depressive Disorder  Persistent depressive disorder (PDD) is a mental health condition. PDD causes symptoms of low-level depression for 2 years or longer. It may also be called long-term (chronic) depression or dysthymia. PDD may include episodes of more severe depression that last for about 2 weeks (major depressive disorder or MDD). PDD can affect the way you think, feel, and sleep. This condition may also affect your relationships. You may be more likely to get sick if you have PDD. Symptoms of PDD occur for most of the day and may include:  Feeling tired (fatigue).  Low energy.  Eating too much or too little.  Sleeping too much or too little.  Feeling restless or agitated.  Feeling hopeless.  Feeling worthless or guilty.  Feeling worried or nervous (anxiety).  Trouble concentrating or making decisions.  Low self-esteem.  A negative way of looking at things (outlook).  Not being able to have fun or feel pleasure.  Avoiding interacting with people.  Getting angry or annoyed easily (irritability).  Acting aggressive or angry. Follow these instructions at home: Activity  Go back to your normal activities as told by your doctor.  Exercise regularly as told by your doctor. General instructions  Take over-the-counter and prescription medicines only as told by your doctor.  Do not drink alcohol. Or, limit how much alcohol you drink to no more than 1 drink a day for nonpregnant women and 2 drinks a day for men. One drink equals 12 oz of beer, 5 oz of wine, or 1 oz of hard liquor. Alcohol can affect any antidepressant medicines you are taking. Talk with your doctor about your alcohol use.  Eat a healthy diet and get plenty of sleep.  Find activities that you enjoy each day.  Consider joining a support group. Your doctor may be able to suggest a support group.  Keep all follow-up visits as told by your doctor. This is important. Where to find more  information [employer] on Mental Illness  www.nami.org [place] of Mental Health  http://www.maynard.net/ National Suicide Prevention Lifeline  978-214-3211).  This is free, 24-hour help. Contact a doctor if:  Your symptoms get worse.  You have new symptoms.  You have trouble sleeping or doing your daily activities. Get help right away if:  You self-harm.  You have serious thoughts about hurting yourself or others.  You see, hear, taste, smell, or feel things that are not there (hallucinate). This information is not intended to replace advice given to you by your health care provider. Make sure you discuss any questions you have with your health care provider. Document Revised: 02/10/2017 Document Reviewed: 10/23/2015 Elsevier Patient Education  2020 Elsevier Inc. Bupropion extended-release tablets (Depression/Mood Disorders) What is this medicine? BUPROPION (byoo PROE pee on) is used to treat depression. This medicine may be used for other purposes; ask your health care provider or pharmacist if you have questions. COMMON BRAND NAME(S): Aplenzin, Budeprion XL, Forfivo XL, Wellbutrin XL What should I tell my health care provider before I take this medicine? They need to know if you have any of these conditions:  an eating disorder, such as anorexia or bulimia  bipolar disorder or psychosis  diabetes or high blood sugar, treated with medication  glaucoma  head injury or brain tumor  heart disease, previous heart attack, or irregular heart beat  high blood pressure  kidney or liver disease  seizures (convulsions)  suicidal thoughts or a previous suicide attempt  Tourette's syndrome  weight loss  an unusual or allergic reaction to bupropion, other medicines, foods, dyes, or preservatives  breast-feeding  pregnant or trying to become pregnant How should I use this medicine? Take this medicine by mouth with a glass of water. Follow the  directions on the prescription label. You can take it with or without food. If it upsets your stomach, take it with food. Do not crush, chew, or cut these tablets. This medicine is taken once daily at the same time each day. Do not take your medicine more often than directed. Do not stop taking this medicine suddenly except upon the advice of your doctor. Stopping this medicine too quickly may cause serious side effects or your condition may worsen. A special MedGuide will be given to you by the pharmacist with each prescription and refill. Be sure to read this information carefully each time. Talk to your pediatrician regarding the use of this medicine in children. Special care may be needed. Overdosage: If you think you have taken too much of this medicine contact a poison control center or emergency room at once. NOTE: This medicine is only for you. Do not share this medicine with others. What if I miss a dose? If you miss a dose, skip the missed dose and take your next tablet at the regular time. Do not take double or extra doses. What may interact with this medicine? Do not take this medicine with any of the following medications:  linezolid  MAOIs like Azilect, Carbex, Eldepryl, Marplan, Nardil, and Parnate  methylene blue (injected into a vein)  other medicines that contain bupropion like Zyban This medicine may also interact with the following medications:  alcohol  certain medicines for anxiety or sleep  certain medicines for blood pressure like metoprolol, propranolol  certain medicines for depression or psychotic disturbances  certain medicines for HIV or AIDS like efavirenz, lopinavir, nelfinavir, ritonavir  certain medicines for irregular heart beat like propafenone, flecainide  certain medicines for Parkinson's disease like amantadine, levodopa  certain medicines for seizures like carbamazepine, phenytoin,  phenobarbital  cimetidine  clopidogrel  cyclophosphamide  digoxin  furazolidone  isoniazid  nicotine  orphenadrine  procarbazine  steroid medicines like prednisone or cortisone  stimulant medicines for attention disorders, weight loss, or to stay awake  tamoxifen  theophylline  thiotepa  ticlopidine  tramadol  warfarin This list may not describe all possible interactions. Give your health care provider a list of all the medicines, herbs, non-prescription drugs, or dietary supplements you use. Also tell them if you smoke, drink alcohol, or use illegal drugs. Some items may interact with your medicine. What should I watch for while using this medicine? Tell your doctor if your symptoms do not get better or if they get worse. Visit your doctor or healthcare provider for regular checks on your progress. Because it may take several weeks to see the full effects of this medicine, it is important to continue your treatment as prescribed by your doctor. This medicine may cause serious skin reactions. They can happen weeks to months after starting the medicine. Contact your healthcare provider right away if you notice fevers or flu-like symptoms with a rash. The rash may be red or purple and then turn into blisters or peeling of the skin. Or, you might notice a red rash with swelling of the face, lips or lymph nodes in your neck or under your arms. Patients and their families should watch out for new or worsening thoughts of suicide or depression. Also watch out for  sudden changes in feelings such as feeling anxious, agitated, panicky, irritable, hostile, aggressive, impulsive, severely restless, overly excited and hyperactive, or not being able to sleep. If this happens, especially at the beginning of treatment or after a change in dose, call your healthcare provider. Avoid alcoholic drinks while taking this medicine. Drinking large amounts of alcoholic beverages, using sleeping or  anxiety medicines, or quickly stopping the use of these agents while taking this medicine may increase your risk for a seizure. Do not drive or use heavy machinery until you know how this medicine affects you. This medicine can impair your ability to perform these tasks. Do not take this medicine close to bedtime. It may prevent you from sleeping. Your mouth may get dry. Chewing sugarless gum or sucking hard candy, and drinking plenty of water may help. Contact your doctor if the problem does not go away or is severe. The tablet shell for some brands of this medicine does not dissolve. This is normal. The tablet shell may appear whole in the stool. This is not a cause for concern. What side effects may I notice from receiving this medicine? Side effects that you should report to your doctor or health care professional as soon as possible:  allergic reactions like skin rash, itching or hives, swelling of the face, lips, or tongue  breathing problems  changes in vision  confusion  elevated mood, decreased need for sleep, racing thoughts, impulsive behavior  fast or irregular heartbeat  hallucinations, loss of contact with reality  increased blood pressure  rash, fever, and swollen lymph nodes  redness, blistering, peeling or loosening of the skin, including inside the mouth  seizures  suicidal thoughts or other mood changes  unusually weak or tired  vomiting Side effects that usually do not require medical attention (report to your doctor or health care professional if they continue or are bothersome):  constipation  headache  loss of appetite  nausea  tremors  weight loss This list may not describe all possible side effects. Call your doctor for medical advice about side effects. You may report side effects to FDA at 1-800-FDA-1088. Where should I keep my medicine? Keep out of the reach of children. Store at room temperature between 15 and 30 degrees C (59 and 86  degrees F). Throw away any unused medicine after the expiration date. NOTE: This sheet is a summary. It may not cover all possible information. If you have questions about this medicine, talk to your doctor, pharmacist, or health care provider.  2020 Elsevier/Gold Standard (2018-05-24 13:45:31)

## 2019-12-24 NOTE — Progress Notes (Signed)
Established patient visit   Patient: Lawrence Andrews   DOB: 13-Apr-1985   33 y.o. Male  MRN: [id number] Visit Date: 12/24/2019  Today's healthcare provider: Jairo Ben, FNP   Chief Complaint  Patient presents with   Follow-up   Subjective    HPI  Anxiety, Follow-up  He was last seen for anxiety 1 months ago. Changes made at last visit include referral to psychiatry . And psychology.   He is going to get married October 23,2021.    He reports poor compliance with treatment. Patient discontinued Lexapro 15 mg was noted at last visit.  He reports poor tolerance of treatment. He is having side effects. Patient reports symptoms of restless leg and is unsure if related to anxiety   He reports that he took Effexor in the past, was not helpful.  He also stooped Lexapro 15mg  he reported at that visit on 11/16/2019 He feels his anxiety is moderate and Worse since last visit.He has not gone for to therapist.   He was seen last by 01/16/2020 PA-C and again reffered to psychiatry and psychology/  Appointment with social worker at behavioral medicine was canceled 12/19/2019 by patient.   Denies any injuries or surgeries.  He has fatigue.   He  Has Alpha Gal, takes b12 weekly every other day. b12 was elevated last check,does have some paresthesias and muscle aches upper and lowe extremities at times. Denies any trauma, no back or neck pain.  Beer daily.   Symptoms: No chest pain Yes difficulty concentrating  Yes dizziness Yes fatigue  No feelings of losing control No insomnia  Yes irritable Yes palpitations ( has seen cardiology)  Yes panic attacks Yes racing thoughts  Yes shortness of breath No sweating  Yes tremors/shakes   He associates all of the above episodes with anxiety/ depression and reports he can talk him self down and out of them.   He denies any suicidal or homicidal ideations or intents now or in past.  He reports he would like STD screening,  denies any symptoms, is getting married and wants tested in case he had a previous exposure.   Patient  denies any fever,chills, rash, chest pain, shortness of breath, nausea, vomiting, or diarrhea.  Denies any current   dizziness, lightheadedness, or any history of pre syncopal or syncopal episodes.     GAD-7 Results GAD-7 Generalized Anxiety Disorder Screening Tool 12/24/2019 11/26/2019 07/16/2019  1. Feeling Nervous, Anxious, or on Edge 3 2 3   2. Not Being Able to Stop or Control Worrying 3 3 2   3. Worrying Too Much About Different Things 3 2 3   4. Trouble Relaxing 3 3 2   5. Being So Restless it's Hard To Sit Still 2 2 1   6. Becoming Easily Annoyed or Irritable 3 2 1   7. Feeling Afraid As If Something Awful Might Happen 3 2 1   Total GAD-7 Score 20 16 13   Difficulty At Work, Home, or Getting  Along With Others? Very difficult Not difficult at all Somewhat difficult    PHQ-9 Scores PHQ9 SCORE ONLY 12/24/2019 11/26/2019 07/16/2019  PHQ-9 Total Score 10 8 4     ---------------------------------------------------------------------------------------------------      Medications: Outpatient Medications Prior to Visit  Medication Sig   cetirizine (ZYRTEC ALLERGY) 10 MG tablet Take 1 tablet (10 mg total) by mouth daily. (Patient not taking: Reported on 12/24/2019)   clonazePAM (KLONOPIN) 1 MG disintegrating tablet Take 1 tablet (1 mg total) by mouth 2 (two) times  daily as needed (anxiety plan to wean as Lexapro dose was increased).   famotidine (PEPCID) 20 MG tablet Take 1 tablet (20 mg total) by mouth 2 (two) times daily. (Patient not taking: Reported on 12/24/2019)   fluconazole (DIFLUCAN) 150 MG tablet Take 1 tablet (150 mg total) by mouth as directed. Take one tablet by mouth on day 1. May repeat dose of one tablet by mouth on day four. Avoid alcohol for 7 days. (Patient not taking: Reported on 12/24/2019)   levocetirizine (XYZAL) 5 MG tablet Take 1 tablet (5 mg total) by mouth every  evening. (Patient not taking: Reported on 12/24/2019)   pantoprazole (PROTONIX) 40 MG tablet Take 40 mg by mouth daily. (Patient not taking: Reported on 12/24/2019)   [DISCONTINUED] escitalopram (LEXAPRO) 10 MG tablet Take 1 tablet (10 mg total) by mouth daily. Taking with 5 mg tablet for total of 15 mg po qd. (Patient not taking: Reported on 11/26/2019)   [DISCONTINUED] escitalopram (LEXAPRO) 5 MG tablet Take 1 tablet (5 mg total) by mouth daily. (Patient not taking: Reported on 11/26/2019)   [DISCONTINUED] predniSONE (STERAPRED UNI-PAK 21 TAB) 10 MG (21) TBPK tablet Take 6 tabs by mouth daily  for 1 days, then 5 tabs for 1 days, then 4 tabs for 1 days, then 3 tabs for 1 days, 2 tabs for 1 days, then 1 tab by mouth daily for 1 days   [DISCONTINUED] triamcinolone cream (KENALOG) 0.1 % Apply 1 application topically 2 (two) times daily.   No facility-administered medications prior to visit.    Review of Systems  Constitutional: Positive for fatigue. Negative for activity change, appetite change, chills, diaphoresis, fever and unexpected weight change.  HENT: Negative.   Respiratory: Negative.   Cardiovascular: Negative.   Gastrointestinal: Negative.   Genitourinary: Negative.   Musculoskeletal: Negative.   Skin: Negative.  Negative for rash.  Neurological: Positive for numbness. Negative for dizziness, tremors, seizures, syncope, facial asymmetry, speech difficulty, weakness, light-headedness and headaches.  Hematological: Negative.   Psychiatric/Behavioral: Negative.       Objective    BP (!) 141/91    Pulse 81    Temp 98.5 F (36.9 C) (Oral)    Resp 16    Wt 260 lb (117.9 kg)    BMI 33.38 kg/m    Physical Exam Constitutional:      General: He is not in acute distress.    Appearance: Normal appearance. He is obese. He is not ill-appearing or toxic-appearing.  HENT:     Head: Normocephalic and atraumatic.     Right Ear: External ear normal. There is no impacted cerumen.      Left Ear: External ear normal. There is no impacted cerumen.     Mouth/Throat:     Mouth: Mucous membranes are moist.  Eyes:     Conjunctiva/sclera: Conjunctivae normal.  Cardiovascular:     Rate and Rhythm: Normal rate and regular rhythm.     Pulses: Normal pulses.     Heart sounds: Normal heart sounds. No murmur heard.  No friction rub. No gallop.   Pulmonary:     Effort: Pulmonary effort is normal. No respiratory distress.     Breath sounds: Normal breath sounds. No stridor. No wheezing, rhonchi or rales.  Chest:     Chest wall: No tenderness.  Abdominal:     General: There is no distension.     Palpations: Abdomen is soft.     Tenderness: There is no abdominal tenderness.  Musculoskeletal:  General: Normal range of motion.     Cervical back: Normal range of motion and neck supple.  Skin:    General: Skin is warm.     Capillary Refill: Capillary refill takes less than 2 seconds.     Coloration: Skin is not jaundiced.     Findings: No lesion or rash.  Neurological:     General: No focal deficit present.     Mental Status: He is alert and oriented to person, place, and time.     Cranial Nerves: No cranial nerve deficit.     Motor: No weakness.     Gait: Gait normal.  Psychiatric:        Mood and Affect: Mood normal.        Behavior: Behavior normal.        Thought Content: Thought content normal.        Judgment: Judgment normal.     No results found for any visits on 12/24/19.  Assessment & Plan    Anxiety - Plan: B12 and Folate Panel, CBC with Differential/Platelet, Comprehensive Metabolic Panel (CMET)  Screening for STD (sexually transmitted disease) - Plan: RPR, Hepatitis C Antibody, Hepatitis panel, acute, HIV antibody (with reflex), Urine cytology ancillary only, CANCELED: Urine cytology ancillary only  Moderate episode of recurrent major depressive disorder (HCC) - Plan: Lipid Panel w/o Chol/HDL Ratio, TSH  Other fatigue  Fatigue, unspecified type -  Plan: VITAMIN D 25 Hydroxy (Vit-D Deficiency, Fractures)  Muscle spasm  Orders Placed This Encounter  Procedures   B12 and Folate Panel   CBC with Differential/Platelet   Comprehensive Metabolic Panel (CMET)   Lipid Panel w/o Chol/HDL Ratio   TSH   VITAMIN D 25 Hydroxy (Vit-D Deficiency, Fractures)   RPR   Hepatitis C Antibody   Hepatitis panel, acute   HIV antibody (with reflex)   Meds ordered this encounter  Medications   buPROPion (WELLBUTRIN XL) 300 MG 24 hr tablet    Sig: Take 1 tablet (300 mg total) by mouth daily.    Dispense:  60 tablet    Refill:  0   cyclobenzaprine (FLEXERIL) 10 MG tablet    Sig: Take 1 tablet (10 mg total) by mouth at bedtime as needed for muscle spasms.    Dispense:  30 tablet    Refill:  0   Call if any symptoms change or worsening.    Will check labs. Start Wellbutrin XL 300 mg 24 hour tablet by mouth daily, he has been advised to take 150 mg ( half tablet) for the first 2 weeks daily and then can increase to Wellbutrin 300 XL    Discussed known black box warning for anti depression/ anxiety medication. Need to report any behavioral changes right, if any homicidal or suicidal thoughts or ideas seek medical attention right away. Call 911.   Addressed acute and or chronic medical problems today requiring 30 minutes reviewing patients medical record,labs, counseling patient regarding patient's conditions, any medications, answering questions regarding health, and coordination of care as needed. After visit summary patient given copy and reviewed.  Return in about 1 month (around 01/24/2020), or if symptoms worsen or fail to improve, for at any time for any worsening symptoms, Go to Emergency room/ urgent care if worse.       Jairo Ben, FNP  [facility] [phone] (phone) [phone] (fax)  [facility]

## 2019-12-24 NOTE — Addendum Note (Signed)
Addended by: Fonda Kinder on: 12/24/2019 04:15 PM   Modules accepted: Orders

## 2019-12-26 ENCOUNTER — Other Ambulatory Visit: Payer: Self-pay | Admitting: Adult Health

## 2019-12-26 DIAGNOSIS — E559 Vitamin D deficiency, unspecified: Secondary | ICD-10-CM

## 2019-12-26 LAB — CBC WITH DIFFERENTIAL/PLATELET
Basophils Absolute: 0 10*3/uL (ref 0.0–0.2)
Basos: 1 %
EOS (ABSOLUTE): 0.2 10*3/uL (ref 0.0–0.4)
Eos: 4 %
Hematocrit: 48.8 % (ref 37.5–51.0)
Hemoglobin: 16.3 g/dL (ref 13.0–17.7)
Immature Grans (Abs): 0 10*3/uL (ref 0.0–0.1)
Immature Granulocytes: 1 %
Lymphocytes Absolute: 1.7 10*3/uL (ref 0.7–3.1)
Lymphs: 28 %
MCH: 31.8 pg (ref 26.6–33.0)
MCHC: 33.4 g/dL (ref 31.5–35.7)
MCV: 95 fL (ref 79–97)
Monocytes Absolute: 0.6 10*3/uL (ref 0.1–0.9)
Monocytes: 10 %
Neutrophils Absolute: 3.6 10*3/uL (ref 1.4–7.0)
Neutrophils: 56 %
Platelets: 197 10*3/uL (ref 150–450)
RBC: 5.13 x10E6/uL (ref 4.14–5.80)
RDW: 11.4 % — ABNORMAL LOW (ref 11.6–15.4)
WBC: 6.3 10*3/uL (ref 3.4–10.8)

## 2019-12-26 LAB — LIPID PANEL W/O CHOL/HDL RATIO
Cholesterol, Total: 224 mg/dL — ABNORMAL HIGH (ref 100–199)
HDL: 57 mg/dL (ref >39)
LDL Chol Calc (NIH): 138 mg/dL — ABNORMAL HIGH (ref 0–99)
Triglycerides: 163 mg/dL — ABNORMAL HIGH (ref 0–149)
VLDL Cholesterol Cal: 29 mg/dL (ref 5–40)

## 2019-12-26 LAB — COMPREHENSIVE METABOLIC PANEL
ALT: 16 IU/L (ref 0–44)
AST: 19 IU/L (ref 0–40)
Albumin/Globulin Ratio: 1.8 (ref 1.2–2.2)
Albumin: 4.6 g/dL (ref 4.0–5.0)
Alkaline Phosphatase: 84 IU/L (ref 44–121)
BUN/Creatinine Ratio: 16 (ref 9–20)
BUN: 18 mg/dL (ref 6–20)
Bilirubin Total: 0.4 mg/dL (ref 0.0–1.2)
CO2: 24 mmol/L (ref 20–29)
Calcium: 9.3 mg/dL (ref 8.7–10.2)
Chloride: 98 mmol/L (ref 96–106)
Creatinine, Ser: 1.14 mg/dL (ref 0.76–1.27)
GFR calc Af Amer: 96 mL/min/{1.73_m2} (ref >59)
GFR calc non Af Amer: 83 mL/min/{1.73_m2} (ref >59)
Globulin, Total: 2.6 g/dL (ref 1.5–4.5)
Glucose: 99 mg/dL (ref 65–99)
Potassium: 4.5 mmol/L (ref 3.5–5.2)
Sodium: 140 mmol/L (ref 134–144)
Total Protein: 7.2 g/dL (ref 6.0–8.5)

## 2019-12-26 LAB — HEPATITIS PANEL, ACUTE
Hep A IgM: NEGATIVE
Hep B C IgM: NEGATIVE
Hep C Virus Ab: <0.1 s/co ratio (ref 0.0–0.9)
Hepatitis B Surface Ag: NEGATIVE

## 2019-12-26 LAB — HIV ANTIBODY (ROUTINE TESTING W REFLEX): HIV Screen 4th Generation wRfx: NONREACTIVE

## 2019-12-26 LAB — VITAMIN D 25 HYDROXY (VIT D DEFICIENCY, FRACTURES): Vit D, 25-Hydroxy: 24.8 ng/mL — ABNORMAL LOW (ref 30.0–100.0)

## 2019-12-26 LAB — B12 AND FOLATE PANEL
Folate: 8.5 ng/mL (ref >3.0)
Vitamin B-12: 684 pg/mL (ref 232–1245)

## 2019-12-26 LAB — TSH: TSH: 1.85 u[IU]/mL (ref 0.450–4.500)

## 2019-12-26 LAB — RPR: RPR Ser Ql: NONREACTIVE

## 2019-12-26 MED ORDER — VITAMIN D (ERGOCALCIFEROL) 1.25 MG (50000 UNIT) PO CAPS: 50000.0000 [IU] | ORAL_CAPSULE | ORAL | 0 refills | Status: DC

## 2019-12-26 NOTE — Progress Notes (Signed)
B12 now within normal limits.  CBC within normal limits. CMP within normal limits.  TSH for thyroid within normal limits.  Vitamin D is low, recommend Vitamin D  at 50,000 units to take by mouth once every 7 days ( once weekly) for 12 weeks. Recheck  vitamin d lab  in 1- 2 weeks after completing.  Sent [facility] to [facility] [place] and placed lab order for walk in lab 1-2 weeks after finishing prescription.   RPR for syphilis negative.  Acute hepatitis panel negative.  HIV non reactive.   Total cholesterol and LDL elevated but has improved with diet and exercise. Triglycerides are high, would advise to follow low triglyceride diet avoid excessive starches, alcohol and processed foods. Discuss lifestyle modification with patient e.g. increase exercise, fiber, fruits, vegetables, lean meat, and omega 3/fish intake and decrease saturated fat.  If patient following strict diet and exercise program already please schedule follow up appointment with primary care physician Recheck lipid panel in 6 months.  Still waiting on the urine test and the hepatitis test, will result once it releases.

## 2019-12-28 LAB — CHLAMYDIA/GONOCOCCUS/TRICHOMONAS, NAA
Chlamydia by NAA: NEGATIVE
Gonococcus by NAA: NEGATIVE
Trich vag by NAA: NEGATIVE

## 2019-12-30 NOTE — Progress Notes (Signed)
Negative STD testing for G/C  and trichomonas. Results released to Mychart.

## 2020-01-16 ENCOUNTER — Other Ambulatory Visit: Payer: Self-pay | Admitting: Adult Health

## 2020-01-16 NOTE — Telephone Encounter (Signed)
  Notes to clinic: Patient is requesting a 90 day supply on this medication. Looks like patient has a follow up on medication to see how it's working   Requested Prescriptions  Pending Prescriptions Disp Refills   buPROPion (WELLBUTRIN XL) 300 MG 24 hr tablet [Pharmacy Med Name: BUPROPION HCL XL 300 MG TABLET] 90 tablet 1    Sig: TAKE 1 TABLET BY MOUTH EVERY DAY      Psychiatry: Antidepressants - bupropion Failed - 01/16/2020  8:30 AM      Failed - Last BP in normal range    BP Readings from Last 1 Encounters:  12/24/19 (!) 141/91          Passed - Completed PHQ-2 or PHQ-9 in the last 360 days      Passed - Valid encounter within last 6 months    Recent Outpatient Visits           3 weeks ago Anxiety   [facility] Flinchum, Eula Fried, FNP   1 month ago Anxiety   [facility] Osvaldo Angst M, PA-C   6 months ago Encounter for routine adult health examination without abnormal findings   [employer], Eula Fried, FNP       Future Appointments             In 1 week Flinchum, Eula Fried, FNP [facility]

## 2020-01-16 NOTE — Telephone Encounter (Signed)
Please advised filed 10/12 with a 60day supply. KW

## 2020-01-28 ENCOUNTER — Encounter: Payer: Self-pay | Admitting: Adult Health

## 2020-01-28 ENCOUNTER — Ambulatory Visit
Admission: RE | Admit: 2020-01-28 | Discharge: 2020-01-28 | Disposition: A | Discharge status: Home / Self Care | Payer: Managed Care, Other (non HMO) | Source: Ambulatory Visit | Attending: Adult Health | Admitting: Adult Health

## 2020-01-28 ENCOUNTER — Ambulatory Visit
Admission: RE | Admit: 2020-01-28 | Discharge: 2020-01-28 | Disposition: A | Discharge status: Home / Self Care | Payer: Managed Care, Other (non HMO) | Attending: Adult Health | Admitting: Adult Health

## 2020-01-28 ENCOUNTER — Ambulatory Visit (INDEPENDENT_AMBULATORY_CARE_PROVIDER_SITE_OTHER): Payer: Managed Care, Other (non HMO) | Admitting: Adult Health

## 2020-01-28 ENCOUNTER — Other Ambulatory Visit: Payer: Self-pay

## 2020-01-28 VITALS — BP 127/75 | HR 80 | Temp 98.7°F | Resp 16 | Ht 74.0 in | Wt 256.0 lb

## 2020-01-28 DIAGNOSIS — M549 Dorsalgia, unspecified: Secondary | ICD-10-CM | POA: Diagnosis not present

## 2020-01-28 DIAGNOSIS — G8929 Other chronic pain: Secondary | ICD-10-CM | POA: Insufficient documentation

## 2020-01-28 DIAGNOSIS — F419 Anxiety disorder, unspecified: Secondary | ICD-10-CM

## 2020-01-28 DIAGNOSIS — M792 Neuralgia and neuritis, unspecified: Secondary | ICD-10-CM | POA: Diagnosis not present

## 2020-01-28 DIAGNOSIS — E559 Vitamin D deficiency, unspecified: Secondary | ICD-10-CM | POA: Diagnosis not present

## 2020-01-28 MED ORDER — CLONAZEPAM 0.5 MG PO TABS: 0.5000 mg | ORAL_TABLET | Freq: Two times a day (BID) | ORAL | 0 refills | Status: DC | PRN

## 2020-01-28 MED ORDER — VITAMIN D (ERGOCALCIFEROL) 1.25 MG (50000 UNIT) PO CAPS: 50000.0000 [IU] | ORAL_CAPSULE | ORAL | 0 refills | Status: DC

## 2020-01-28 NOTE — Progress Notes (Signed)
Established patient visit   Patient: Lawrence Andrews   DOB: 1985/06/06   34 y.o. Male  MRN: [id number] Visit Date: 01/28/2020  Today's healthcare provider: Jairo Ben, FNP   Chief Complaint  Patient presents with  . Anxiety   Subjective    HPI  Follow up for anxiety  The patient was last seen for this 1 months ago. Changes made at last visit include starting Wellbutrin 300 mg XL  daily.  He  Has cut back on alcohol and drinking only 1-4 beers daily. He is worrying over daily.   History Alpha Gal/    He has been going to massage. He has mid to upper back pain. Occasionally has radiculopathy into right arm.Denies any chest pain.  He reports his legs have been restless. He reports all of this has been chronic. He denies any injury.    He reports good compliance with treatment. He feels that condition is Unchanged. He is not having side effects.   denies any suicidal ideations or intents.  Have referred to psychiatry attempts in the past he is checking with his insurance for an in network provider as well.    Patient  denies any fever,chills, rash, chest pain, shortness of breath, nausea, vomiting, or diarrhea.   Denies dizziness, lightheadedness, pre syncopal or syncopal episodes.   Patient Active Problem List   Diagnosis Date Noted  . Radicular pain in right arm 01/28/2020  . Chronic midline back pain 01/28/2020  . Muscle spasm 12/24/2019  . Allergy to alpha-gal 10/14/2019  . Other allergic rhinitis 10/14/2019  . Anxiety 07/16/2019  . Body mass index (BMI) of 32.0-32.9 in adult 07/16/2019  . Skin yeast infection 07/16/2019  . Fatigue 07/16/2019  . Vitamin B12 deficiency 07/16/2019  . History of panic attacks 07/16/2019  . Screening for STD (sexually transmitted disease) 07/16/2019  . Recurrent urticaria 04/16/2019  . Angioedema 04/16/2019  . Epistaxis 04/16/2019  . Allergic reaction 04/16/2019  . Palpitations 02/27/2019  . Hyperlipidemia,  mixed 02/27/2019  . Snoring 07/23/2018  . Low testosterone 07/23/2018  . Hypotestosteronemia in male 07/07/2017  . Vitamin D deficiency 07/05/2017  . Tinea versicolor 07/05/2017  . Gastroesophageal reflux disease without esophagitis 02/24/2017  . Chewing tobacco nicotine dependence without complication 02/24/2017  . Moderate episode of recurrent major depressive disorder (HCC) 08/10/2016  . Panic attacks 05/20/2016  . Generalized anxiety disorder 05/20/2016  . Essential hypertension 05/20/2016   Past Medical History:  Diagnosis Date  . Allergy   . Allergy to alpha-gal   . Anxiety   . Depression   . Hypertension    Allergies  Allergen Reactions  . Alpha-Gal     Red meat  . Other     Fire ants and pollen       Medications: Outpatient Medications Prior to Visit  Medication Sig  . buPROPion (WELLBUTRIN XL) 300 MG 24 hr tablet TAKE 1 TABLET BY MOUTH EVERY DAY  . cyclobenzaprine (FLEXERIL) 10 MG tablet Take 1 tablet (10 mg total) by mouth at bedtime as needed for muscle spasms.  . [DISCONTINUED] Vitamin D, Ergocalciferol, (DRISDOL) 1.25 MG (50000 UNIT) CAPS capsule Take 1 capsule (50,000 Units total) by mouth every 7 (seven) days.  . cetirizine (ZYRTEC ALLERGY) 10 MG tablet Take 1 tablet (10 mg total) by mouth daily. (Patient not taking: Reported on 12/24/2019)  . famotidine (PEPCID) 20 MG tablet Take 1 tablet (20 mg total) by mouth 2 (two) times daily. (Patient not taking: Reported on 12/24/2019)  .  levocetirizine (XYZAL) 5 MG tablet Take 1 tablet (5 mg total) by mouth every evening. (Patient not taking: Reported on 12/24/2019)  . pantoprazole (PROTONIX) 40 MG tablet Take 40 mg by mouth daily. (Patient not taking: Reported on 12/24/2019)  . [DISCONTINUED] clonazePAM (KLONOPIN) 1 MG disintegrating tablet Take 1 tablet (1 mg total) by mouth 2 (two) times daily as needed (anxiety plan to wean as Lexapro dose was increased).  . [DISCONTINUED] fluconazole (DIFLUCAN) 150 MG tablet Take  1 tablet (150 mg total) by mouth as directed. Take one tablet by mouth on day 1. May repeat dose of one tablet by mouth on day four. Avoid alcohol for 7 days. (Patient not taking: Reported on 12/24/2019)   No facility-administered medications prior to visit.    Review of Systems  Constitutional: Negative.   HENT: Negative.   Respiratory: Negative.   Cardiovascular: Negative.   Gastrointestinal: Negative.   Genitourinary: Negative.   Musculoskeletal: Negative.   Skin: Negative.   Neurological: Negative.   Psychiatric/Behavioral: Positive for agitation. Negative for behavioral problems, confusion, decreased concentration, dysphoric mood, hallucinations, self-injury, sleep disturbance and suicidal ideas. The patient is nervous/anxious. The patient is not hyperactive.     Last CBC Lab Results  Component Value Date   WBC 6.3 12/25/2019   HGB 16.3 12/25/2019   HCT 48.8 12/25/2019   MCV 95 12/25/2019   MCH 31.8 12/25/2019   RDW 11.4 (L) 12/25/2019   PLT 197 12/25/2019   Last metabolic panel Lab Results  Component Value Date   GLUCOSE 99 12/25/2019   NA 140 12/25/2019   K 4.5 12/25/2019   CL 98 12/25/2019   CO2 24 12/25/2019   BUN 18 12/25/2019   CREATININE 1.14 12/25/2019   GFRNONAA 83 12/25/2019   GFRAA 96 12/25/2019   CALCIUM 9.3 12/25/2019   PROT 7.2 12/25/2019   ALBUMIN 4.6 12/25/2019   LABGLOB 2.6 12/25/2019   AGRATIO 1.8 12/25/2019   BILITOT 0.4 12/25/2019   ALKPHOS 84 12/25/2019   AST 19 12/25/2019   ALT 16 12/25/2019   ANIONGAP 8 02/16/2019      Objective    BP 127/75   Pulse 80   Temp 98.7 F (37.1 C)   Resp 16   Ht 6\' 2"  (1.88 m)   Wt 256 lb (116.1 kg)   BMI 32.87 kg/m  Wt Readings from Last 3 Encounters:  01/28/20 256 lb (116.1 kg)  12/24/19 260 lb (117.9 kg)  11/26/19 250 lb 9.6 oz (113.7 kg)      Physical Exam Vitals reviewed.  Constitutional:      General: He is not in acute distress.    Appearance: Normal appearance. He is not  ill-appearing, toxic-appearing or diaphoretic.  HENT:     Head: Normocephalic and atraumatic.     Right Ear: External ear normal.     Left Ear: External ear normal.     Mouth/Throat:     Mouth: Mucous membranes are moist.  Eyes:     General: No scleral icterus.       Right eye: No discharge.        Left eye: No discharge.     Extraocular Movements: Extraocular movements intact.  Neck:     Vascular: No carotid bruit.  Cardiovascular:     Rate and Rhythm: Normal rate and regular rhythm.     Pulses: Normal pulses.     Heart sounds: Normal heart sounds. No murmur heard.  No friction rub. No gallop.   Pulmonary:  Effort: Pulmonary effort is normal. No respiratory distress.     Breath sounds: Normal breath sounds. No stridor. No wheezing, rhonchi or rales.  Chest:     Chest wall: No tenderness.  Abdominal:     General: There is no distension.     Palpations: Abdomen is soft.     Tenderness: There is no abdominal tenderness.  Musculoskeletal:        General: Normal range of motion.     Cervical back: Normal range of motion and neck supple. No rigidity or tenderness.  Lymphadenopathy:     Cervical: No cervical adenopathy.  Skin:    General: Skin is warm.     Capillary Refill: Capillary refill takes less than 2 seconds.     Findings: No erythema.  Neurological:     Mental Status: He is oriented to person, place, and time.  Psychiatric:        Mood and Affect: Mood normal.        Behavior: Behavior normal.        Thought Content: Thought content normal.        Judgment: Judgment normal.     Patient is alert and oriented and responsive to questions Engages in eye contact with provider. Speaks in full sentences without any pauses without any shortness of breath or distress.   No results found for any visits on 01/28/20.  Assessment & Plan    Chronic midline back pain, unspecified back location - Plan: DG Cervical Spine Complete, DG Thoracic Spine 2 View, DG Lumbar Spine  Complete  Radicular pain in right arm - Plan: DG Cervical Spine Complete, DG Thoracic Spine 2 View, DG Lumbar Spine Complete  Anxiety - Plan: clonazePAM (KLONOPIN) 0.5 MG tablet  Vitamin D deficiency - Plan: Vitamin D, Ergocalciferol, (DRISDOL) 1.25 MG (50000 UNIT) CAPS capsule   Meds ordered this encounter  Medications  . Vitamin D, Ergocalciferol, (DRISDOL) 1.25 MG (50000 UNIT) CAPS capsule    Sig: Take 1 capsule (50,000 Units total) by mouth every 7 (seven) days.    Dispense:  12 capsule    Refill:  0  . clonazePAM (KLONOPIN) 0.5 MG tablet    Sig: Take 1 tablet (0.5 mg total) by mouth 2 (two) times daily as needed for anxiety (only as needed).    Dispense:  30 tablet    Refill:  0   Recheck labs at next visit or prior. Vitamin D.  Decreased dose of klonopin to 0.5mg  BID, he has not been taking, but while waiting on full effectiveness ok to try klonopin on as needed basis. Discussed not to mix with alcohol or other CNS depressants. Discussed risks of use and not for long term. Will continue Wellbutrin since he has only taken one month.  Discussed known black box warning for anti depression/ anxiety medication. Need to report any behavioral changes right, if any homicidal or suicidal thoughts or ideas seek medical attention right away. Call 911.   Consider alternatives.   He declined another referral to psychiatry he will let me know once he hears from his insurance.   Orders Placed This Encounter  Procedures  . DG Cervical Spine Complete  . DG Thoracic Spine 2 View  . DG Lumbar Spine Complete    Red Flags discussed. The patient was given clear instructions to go to ER or return to medical center if any red flags develop, symptoms do not improve, worsen or new problems develop. They verbalized understanding.    Needs GAD and  PHQ at next visit follow up on anxiety and leg pain.  Return in about 1 month (around 02/27/2020), or if symptoms worsen or fail to improve, for at any  time for any worsening symptoms, Go to Emergency room/ urgent care if worse.      Jairo Ben, FNP  [facility] [phone] (phone) [phone] (fax)  [facility]

## 2020-01-28 NOTE — Patient Instructions (Signed)

## 2020-01-29 ENCOUNTER — Telehealth: Payer: Self-pay

## 2020-01-29 NOTE — Progress Notes (Signed)
Thoracic or middle back x ray shows within normal limits.

## 2020-01-29 NOTE — Telephone Encounter (Signed)
-----   Message from Berniece Pap, FNP sent at 01/29/2020  4:51 PM EST ----- Thoracic or middle back x ray shows within normal limits.

## 2020-01-29 NOTE — Telephone Encounter (Signed)
Written by Berniece Pap, FNP on 01/29/2020 4:51 PM EST Seen by patient Lawrence Andrews on 01/29/2020 5:02 PM

## 2020-01-29 NOTE — Telephone Encounter (Signed)
Copied from CRM [id number]. Topic: General - Other >> Jan 28, 2020  2:03 PM Lyn Hollingshead D wrote: PT just left the office / has question about his medications refills wants a call back from a nurse / please advise

## 2020-01-29 NOTE — Telephone Encounter (Signed)
-----   Message from Berniece Pap, FNP sent at 01/29/2020  4:51 PM EST ----- Lumbar  spine - shows Likely muscle spasm in lower back.  Tiny bone spurs on L3.

## 2020-01-29 NOTE — Progress Notes (Signed)
Cervical spine - Narrowing of C5-C6, and C7-T1, and on the left at C3-C4.  Straightening of the normal curve usually signifies muscle spasm. Would recommend trying muscle relaxer as prescribed.   Recommend MRI given numbness in right upper extremity or can make referral to neurosurgeon now. Let me know what you prefer ?

## 2020-01-29 NOTE — Progress Notes (Signed)
Lumbar  spine - shows Likely muscle spasm in lower back.  Tiny bone spurs on L3.

## 2020-01-29 NOTE — Telephone Encounter (Signed)
Patient reports that the pharmacy wont refill his vit d because its "too early" they only gave him 4 tablets instead of the 12 that was prescribed. He reports that even the Rx that was sent yesterday they will not refill. Please advise. Thanks!

## 2020-01-29 NOTE — Telephone Encounter (Signed)
He was only given 4 tablets on 12/26/2019 and not the 12 tablets od prescription Vitamin D 50,000 international units that I ordered. So I refilled yesterday, he is to take one tablet per week for 12 weeks, not sure why pharmacy is only giving 4 I ordered 12 initially.   Please be sure patient is taking as directed and call pharmacy to clarify where the issue is. May be insurance ?

## 2020-01-31 NOTE — Telephone Encounter (Signed)
lmtcb okay for [facility] triage nurse to advise patient of providers message below if he returns call. KW

## 2020-02-01 NOTE — Telephone Encounter (Signed)
Left VM for pt to CB 

## 2020-02-02 NOTE — Telephone Encounter (Signed)
Attempted to contact pt.  Left vm to return call to office on Monday AM @ [phone], to discuss one of his prescriptions.

## 2020-02-11 ENCOUNTER — Telehealth: Payer: Self-pay | Admitting: Adult Health

## 2020-02-11 DIAGNOSIS — G8929 Other chronic pain: Secondary | ICD-10-CM

## 2020-02-11 DIAGNOSIS — F411 Generalized anxiety disorder: Secondary | ICD-10-CM

## 2020-02-11 NOTE — Telephone Encounter (Signed)
Patient will need referral to psychiatry for further psychiatric drug management. Please ask him location he prefers also provide [facility] information for walk in if needed prior. We discussed possible need for psychiatry at last visit and this is highly recommended.   Dizziness persistent ok to refer to neurology for further evaluation.    Please review message for patients x Ray I recommend referral to neurosurgeon for furth er evaluation and work up with MRI ordered by neurosurgery.

## 2020-02-11 NOTE — Telephone Encounter (Signed)
Pt saw michelle on 01-28-2020. Pt states he is still having dizziness and has viewed his xray results on mychart and would like to know the next step. Pt still having tingling and fatigue in his legs

## 2020-02-11 NOTE — Telephone Encounter (Signed)
Pt is calling to let michelle know every since his starting taking bupropion he has been having daily headaches and feeling weird . Pt does not feel like he is where he needs to be mentally. Pt last seen michelle on 01-28-2020

## 2020-02-12 ENCOUNTER — Other Ambulatory Visit: Payer: Self-pay | Admitting: Adult Health

## 2020-02-12 DIAGNOSIS — F411 Generalized anxiety disorder: Secondary | ICD-10-CM

## 2020-02-12 DIAGNOSIS — F331 Major depressive disorder, recurrent, moderate: Secondary | ICD-10-CM

## 2020-02-12 DIAGNOSIS — G8929 Other chronic pain: Secondary | ICD-10-CM

## 2020-02-12 DIAGNOSIS — M792 Neuralgia and neuritis, unspecified: Secondary | ICD-10-CM

## 2020-02-12 DIAGNOSIS — F419 Anxiety disorder, unspecified: Secondary | ICD-10-CM

## 2020-02-12 NOTE — Progress Notes (Signed)
Orders Placed This Encounter  Procedures  . Ambulatory referral to Neurosurgery    Referral Priority:   Routine    Referral Type:   Surgical    Referral Reason:   Specialty Services Required    Requested Specialty:   Neurosurgery    Number of Visits Requested:   1  . Ambulatory referral to Psychiatry    Referral Priority:   Urgent    Referral Type:   Psychiatric    Referral Reason:   Specialty Services Required    Requested Specialty:   Psychiatry    Number of Visits Requested:   1

## 2020-02-12 NOTE — Telephone Encounter (Signed)
See other message

## 2020-02-13 ENCOUNTER — Encounter: Payer: Self-pay | Admitting: Adult Health

## 2020-02-13 ENCOUNTER — Telehealth: Payer: Self-pay

## 2020-02-13 ENCOUNTER — Telehealth (INDEPENDENT_AMBULATORY_CARE_PROVIDER_SITE_OTHER): Payer: Managed Care, Other (non HMO) | Admitting: Adult Health

## 2020-02-13 DIAGNOSIS — J029 Acute pharyngitis, unspecified: Secondary | ICD-10-CM | POA: Diagnosis not present

## 2020-02-13 DIAGNOSIS — H9209 Otalgia, unspecified ear: Secondary | ICD-10-CM | POA: Diagnosis not present

## 2020-02-13 DIAGNOSIS — J0140 Acute pansinusitis, unspecified: Secondary | ICD-10-CM

## 2020-02-13 DIAGNOSIS — J019 Acute sinusitis, unspecified: Secondary | ICD-10-CM | POA: Insufficient documentation

## 2020-02-13 DIAGNOSIS — J3489 Other specified disorders of nose and nasal sinuses: Secondary | ICD-10-CM

## 2020-02-13 MED ORDER — PREDNISONE 10 MG (21) PO TBPK: ORAL_TABLET | ORAL | 0 refills | Status: DC

## 2020-02-13 MED ORDER — FLUTICASONE PROPIONATE 50 MCG/ACT NA SUSP: 2.0000 | Freq: Every day | NASAL | 0 refills | Status: DC

## 2020-02-13 MED ORDER — AMOXICILLIN-POT CLAVULANATE 875-125 MG PO TABS: 1.0000 | ORAL_TABLET | Freq: Two times a day (BID) | ORAL | 0 refills | Status: DC

## 2020-02-13 NOTE — Telephone Encounter (Signed)
Patient was advised and understands. I dropped down order for referral and MRI. If you would review order for MRI to make sure Im ordering the right one, I was unsure if you wanted MRI with and without contrast. Renette Butters

## 2020-02-13 NOTE — Telephone Encounter (Signed)
Will cancel MRI order he should get in with neurosurgeon and they will order MRI of choice. Thank you.

## 2020-02-13 NOTE — Patient Instructions (Signed)
Sinusitis, Adult Sinusitis is soreness and swelling (inflammation) of your sinuses. Sinuses are hollow spaces in the bones around your face. They are located:  Around your eyes.  In the middle of your forehead.  Behind your nose.  In your cheekbones. Your sinuses and nasal passages are lined with a fluid called mucus. Mucus drains out of your sinuses. Swelling can trap mucus in your sinuses. This lets germs (bacteria, virus, or fungus) grow, which leads to infection. Most of the time, this condition is caused by a virus. What are the causes? This condition is caused by:  Allergies.  Asthma.  Germs.  Things that block your nose or sinuses.  Growths in the nose (nasal polyps).  Chemicals or irritants in the air.  Fungus (rare). What increases the risk? You are more likely to develop this condition if:  You have a weak body defense system (immune system).  You do a lot of swimming or diving.  You use nasal sprays too much.  You smoke. What are the signs or symptoms? The main symptoms of this condition are pain and a feeling of pressure around the sinuses. Other symptoms include:  Stuffy nose (congestion).  Runny nose (drainage).  Swelling and warmth in the sinuses.  Headache.  Toothache.  A cough that may get worse at night.  Mucus that collects in the throat or the back of the nose (postnasal drip).  Being unable to smell and taste.  Being very tired (fatigue).  A fever.  Sore throat.  Bad breath. How is this diagnosed? This condition is diagnosed based on:  Your symptoms.  Your medical history.  A physical exam.  Tests to find out if your condition is short-term (acute) or long-term (chronic). Your doctor may: ? Check your nose for growths (polyps). ? Check your sinuses using a tool that has a light (endoscope). ? Check for allergies or germs. ? Do imaging tests, such as an MRI or CT scan. How is this treated? Treatment for this condition  depends on the cause and whether it is short-term or long-term.  If caused by a virus, your symptoms should go away on their own within 10 days. You may be given medicines to relieve symptoms. They include: ? Medicines that shrink swollen tissue in the nose. ? Medicines that treat allergies (antihistamines). ? A spray that treats swelling of the nostrils. ? Rinses that help get rid of thick mucus in your nose (nasal saline washes).  If caused by bacteria, your doctor may wait to see if you will get better without treatment. You may be given antibiotic medicine if you have: ? A very bad infection. ? A weak body defense system.  If caused by growths in the nose, you may need to have surgery. Follow these instructions at home: Medicines  Take, use, or apply over-the-counter and prescription medicines only as told by your doctor. These may include nasal sprays.  If you were prescribed an antibiotic medicine, take it as told by your doctor. Do not stop taking the antibiotic even if you start to feel better. Hydrate and humidify   Drink enough water to keep your pee (urine) pale yellow.  Use a cool mist humidifier to keep the humidity level in your home above 50%.  Breathe in steam for 10-15 minutes, 3-4 times a day, or as told by your doctor. You can do this in the bathroom while a hot shower is running.  Try not to spend time in cool or dry air.   Rest  Rest as much as you can.  Sleep with your head raised (elevated).  Make sure you get enough sleep each night. General instructions   Put a warm, moist washcloth on your face 3-4 times a day, or as often as told by your doctor. This will help with discomfort.  Wash your hands often with soap and water. If there is no soap and water, use hand sanitizer.  Do not smoke. Avoid being around people who are smoking (secondhand smoke).  Keep all follow-up visits as told by your doctor. This is important. Contact a doctor if:  You  have a fever.  Your symptoms get worse.  Your symptoms do not get better within 10 days. Get help right away if:  You have a very bad headache.  You cannot stop throwing up (vomiting).  You have very bad pain or swelling around your face or eyes.  You have trouble seeing.  You feel confused.  Your neck is stiff.  You have trouble breathing. Summary  Sinusitis is swelling of your sinuses. Sinuses are hollow spaces in the bones around your face.  This condition is caused by tissues in your nose that become inflamed or swollen. This traps germs. These can lead to infection.  If you were prescribed an antibiotic medicine, take it as told by your doctor. Do not stop taking it even if you start to feel better.  Keep all follow-up visits as told by your doctor. This is important. This information is not intended to replace advice given to you by your health care provider. Make sure you discuss any questions you have with your health care provider. Document Revised: 07/31/2017 Document Reviewed: 07/31/2017 Elsevier Patient Education  2020 [employer] Upper Respiratory Infection, Adult An upper respiratory infection (URI) affects the nose, throat, and upper air passages. URIs are caused by germs (viruses). The most common type of URI is often called "the common cold." Medicines cannot cure URIs, but you can do things at home to relieve your symptoms. URIs usually get better within 7-10 days. Follow these instructions at home: Activity  Rest as needed.  If you have a fever, stay home from work or school until your fever is gone, or until your doctor says you may return to work or school. ? You should stay home until you cannot spread the infection anymore (you are not contagious). ? Your doctor may have you wear a face mask so you have less risk of spreading the infection. Relieving symptoms  Gargle with a salt-water mixture 3-4 times a day or as needed. To make a salt-water  mixture, completely dissolve -1 tsp of salt in 1 cup of warm water.  Use a cool-mist humidifier to add moisture to the air. This can help you breathe more easily. Eating and drinking   Drink enough fluid to keep your pee (urine) pale yellow.  Eat soups and other clear broths. General instructions   Take over-the-counter and prescription medicines only as told by your doctor. These include cold medicines, fever reducers, and cough suppressants.  Do not use any products that contain nicotine or tobacco. These include cigarettes and e-cigarettes. If you need help quitting, ask your doctor.  Avoid being where people are smoking (avoid secondhand smoke).  Make sure you get regular shots and get the flu shot every year.  Keep all follow-up visits as told by your doctor. This is important. How to avoid spreading infection to others   Wash your hands often with  soap and water. If you do not have soap and water, use hand sanitizer.  Avoid touching your mouth, face, eyes, or nose.  Cough or sneeze into a tissue or your sleeve or elbow. Do not cough or sneeze into your hand or into the air. Contact a doctor if:  You are getting worse, not better.  You have any of these: ? A fever. ? Chills. ? Brown or red mucus in your nose. ? Yellow or brown fluid (discharge)coming from your nose. ? Pain in your face, especially when you bend forward. ? Swollen neck glands. ? Pain with swallowing. ? White areas in the back of your throat. Get help right away if:  You have shortness of breath that gets worse.  You have very bad or constant: ? Headache. ? Ear pain. ? Pain in your forehead, behind your eyes, and over your cheekbones (sinus pain). ? Chest pain.  You have long-lasting (chronic) lung disease along with any of these: ? Wheezing. ? Long-lasting cough. ? Coughing up blood. ? A change in your usual mucus.  You have a stiff neck.  You have changes in  your: ? Vision. ? Hearing. ? Thinking. ? Mood. Summary  An upper respiratory infection (URI) is caused by a germ called a virus. The most common type of URI is often called "the common cold."  URIs usually get better within 7-10 days.  Take over-the-counter and prescription medicines only as told by your doctor. This information is not intended to replace advice given to you by your health care provider. Make sure you discuss any questions you have with your health care provider. Document Revised: 03/08/2018 Document Reviewed: 10/21/2016 Elsevier Patient Education  2020 [employer] Prednisolone tablets What is this medicine? PREDNISOLONE (pred NISS oh lone) is a corticosteroid. It is commonly used to treat inflammation of the skin, joints, lungs, and other organs. Common conditions treated include asthma, allergies, and arthritis. It is also used for other conditions, such as blood disorders and diseases of the adrenal glands. This medicine may be used for other purposes; ask your health care provider or pharmacist if you have questions. COMMON BRAND NAME(S): Millipred, Millipred DP, Millipred DP 12-Day, Millipred DP 6 Day, Prednoral What should I tell my health care provider before I take this medicine? They need to know if you have any of these conditions:  Cushing's syndrome  diabetes  glaucoma  heart problems or disease  high blood pressure  infection such as herpes, measles, tuberculosis, or chickenpox  kidney disease  liver disease  mental problems  myasthenia gravis  osteoporosis  seizures  stomach ulcer or intestine disease including colitis and diverticulitis  thyroid problem  an unusual or allergic reaction to lactose, prednisolone, other medicines, foods, dyes, or preservatives  pregnant or trying to get pregnant  breast-feeding How should I use this medicine? Take this medicine by mouth with a glass of water. Follow the directions on the  prescription label. Take it with food or milk to avoid stomach upset. If you are taking this medicine once a day, take it in the morning. Do not take more medicine than you are told to take. Do not suddenly stop taking your medicine because you may develop a severe reaction. Your doctor will tell you how much medicine to take. If your doctor wants you to stop the medicine, the dose may be slowly lowered over time to avoid any side effects. Talk to your pediatrician regarding the use of this medicine in children.  Special care may be needed. Overdosage: If you think you have taken too much of this medicine contact a poison control center or emergency room at once. NOTE: This medicine is only for you. Do not share this medicine with others. What if I miss a dose? If you miss a dose, take it as soon as you can. If it is almost time for your next dose, take only that dose. Do not take double or extra doses. What may interact with this medicine? Do not take this medicine with any of the following medications:  metyrapone  mifepristone This medicine may also interact with the following medications:  aminoglutethimide  amphotericin B  aspirin and aspirin-like medicines  barbiturates  certain medicines for diabetes, like glipizide or glyburide  cholestyramine  cholinesterase inhibitors  cyclosporine  digoxin  diuretics  ephedrine  male hormones, like estrogens and birth control pills  isoniazid  ketoconazole  NSAIDS, medicines for pain and inflammation, like ibuprofen or naproxen  phenytoin  rifampin  toxoids  vaccines  warfarin This list may not describe all possible interactions. Give your health care provider a list of all the medicines, herbs, non-prescription drugs, or dietary supplements you use. Also tell them if you smoke, drink alcohol, or use illegal drugs. Some items may interact with your medicine. What should I watch for while using this medicine? Visit  your doctor or health care professional for regular checks on your progress. If you are taking this medicine over a prolonged period, carry an identification card with your name and address, the type and dose of your medicine, and your doctor's name and address. This medicine may increase your risk of getting an infection. Tell your doctor or health care professional if you are around anyone with measles or chickenpox, or if you develop sores or blisters that do not heal properly. If you are going to have surgery, tell your doctor or health care professional that you have taken this medicine within the last twelve months. Ask your doctor or health care professional about your diet. You may need to lower the amount of salt you eat. This medicine may increase blood sugar. Ask your healthcare provider if changes in diet or medicines are needed if you have diabetes. What side effects may I notice from receiving this medicine? Side effects that you should report to your doctor or health care professional as soon as possible:  allergic reactions like skin rash, itching or hives, swelling of the face, lips, or tongue  changes in emotions or moods  eye pain   signs and symptoms of high blood sugar such as being more thirsty or hungry or having to urinate more than normal. You may also feel very tired or have blurry vision.  signs and symptoms of infection like fever or chills; cough; sore throat; pain or trouble passing urine  slow growth in children (if used for longer periods of time)  swelling of ankles, feet  trouble sleeping  weak bones (if used for longer periods of time) Side effects that usually do not require medical attention (report to your doctor or health care professional if they continue or are bothersome):  nausea  skin problems, acne, thin and shiny skin  upset stomach  weight gain This list may not describe all possible side effects. Call your doctor for medical advice  about side effects. You may report side effects to FDA at 1-800-FDA-1088. Where should I keep my medicine? Keep out of the reach of children. Store at room  temperature between 15 and 30 degrees C (59 and 86 degrees F). Keep container tightly closed. Throw away any unused medicine after the expiration date. NOTE: This sheet is a summary. It may not cover all possible information. If you have questions about this medicine, talk to your doctor, pharmacist, or health care provider.  2020 Elsevier/Gold Standard (2017-11-30 10:30:56) Amoxicillin; Clavulanic Acid Tablets What is this medicine? AMOXICILLIN; CLAVULANIC ACID (a mox i SIL in; KLAV yoo lan ic AS id) is a penicillin antibiotic. It treats some infections caused by bacteria. It will not work for colds, the flu, or other viruses. This medicine may be used for other purposes; ask your health care provider or pharmacist if you have questions. COMMON BRAND NAME(S): Augmentin What should I tell my health care provider before I take this medicine? They need to know if you have any of these conditions:  bowel disease, like colitis  kidney disease  liver disease  mononucleosis  an unusual or allergic reaction to amoxicillin, penicillin, cephalosporin, other antibiotics, clavulanic acid, other medicines, foods, dyes, or preservatives  pregnant or trying to get pregnant  breast-feeding How should I use this medicine? Take this drug by mouth. Take it as directed on the prescription label at the same time every day. Take it with food at the start of a meal or snack. Take all of this drug unless your health care provider tells you to stop it early. Keep taking it even if you think you are better. Talk to your health care provider about the use of this drug in children. While it may be prescribed for selected conditions, precautions do apply. Overdosage: If you think you have taken too much of this medicine contact a poison control center or  emergency room at once. NOTE: This medicine is only for you. Do not share this medicine with others. What if I miss a dose? If you miss a dose, take it as soon as you can. If it is almost time for your next dose, take only that dose. Do not take double or extra doses. What may interact with this medicine?  allopurinol  anticoagulants  birth control pills  methotrexate  probenecid This list may not describe all possible interactions. Give your health care provider a list of all the medicines, herbs, non-prescription drugs, or dietary supplements you use. Also tell them if you smoke, drink alcohol, or use illegal drugs. Some items may interact with your medicine. What should I watch for while using this medicine? Tell your doctor or healthcare provider if your symptoms do not improve. This medicine may cause serious skin reactions. They can happen weeks to months after starting the medicine. Contact your healthcare provider right away if you notice fevers or flu-like symptoms with a rash. The rash may be red or purple and then turn into blisters or peeling of the skin. Or, you might notice a red rash with swelling of the face, lips or lymph nodes in your neck or under your arms. Do not treat diarrhea with over the counter products. Contact your doctor if you have diarrhea that lasts more than 2 days or if it is severe and watery. If you have diabetes, you may get a false-positive result for sugar in your urine. Check with your doctor or healthcare provider. Birth control pills may not work properly while you are taking this medicine. Talk to your doctor about using an extra method of birth control. What side effects may I notice from receiving this medicine?  Side effects that you should report to your doctor or health care professional as soon as possible:  allergic reactions like skin rash, itching or hives, swelling of the face, lips, or tongue  breathing problems  dark urine  fever or  chills, sore throat  redness, blistering, peeling, or loosening of the skin, including inside the mouth  seizures  trouble passing urine or change in the amount of urine  unusual bleeding, bruising  unusually weak or tired  white patches or sores in the mouth or throat Side effects that usually do not require medical attention (report to your doctor or health care professional if they continue or are bothersome):  diarrhea  dizziness  headache  nausea, vomiting  stomach upset  vaginal or anal irritation This list may not describe all possible side effects. Call your doctor for medical advice about side effects. You may report side effects to FDA at 1-800-FDA-1088. Where should I keep my medicine? Keep out of the reach of children and pets. Store at room temperature between 20 and 25 degrees C (68 and 77 degrees F). Throw away any unused drug after the expiration date. NOTE: This sheet is a summary. It may not cover all possible information. If you have questions about this medicine, talk to your doctor, pharmacist, or health care provider.  2020 Elsevier/Gold Standard (2018-10-01 11:55:53) Fluticasone nasal spray What is this medicine? FLUTICASONE (floo TIK a sone) is a corticosteroid. This medicine is used to treat the symptoms of allergies like sneezing, itchy red eyes, and itchy, runny, or stuffy nose. This medicine is also used to treat nasal polyps. This medicine may be used for other purposes; ask your health care provider or pharmacist if you have questions. COMMON BRAND NAME(S): ClariSpray, Flonase, Flonase Allergy Relief, Flonase Sensimist, Veramyst, XHANCE What should I tell my health care provider before I take this medicine? They need to know if you have any of these conditions:  eye disease, vision problems  infection, like tuberculosis, herpes, or fungal infection  recent surgery on nose or sinuses  taking a corticosteroid by mouth  an unusual or  allergic reaction to fluticasone, steroids, other medicines, foods, dyes, or preservatives  pregnant or trying to get pregnant  breast-feeding How should I use this medicine? This medicine is for use in the nose. Follow the directions on your product or prescription label. This medicine works best if used at regular intervals. Do not use more often than directed. Make sure that you are using your nasal spray correctly. After 6 months of daily use for allergies, talk to your doctor or health care professional before using it for a longer time. Ask your doctor or health care professional if you have any questions. Talk to your pediatrician regarding the use of this medicine in children. Special care may be needed. Some products have been used for allergies in children as young as 2 years. After 2 months of daily use without a prescription in a child, talk to your pediatrician before using it for a longer time. Use of this medicine for nasal polyps is not approved in children. Overdosage: If you think you have taken too much of this medicine contact a poison control center or emergency room at once. NOTE: This medicine is only for you. Do not share this medicine with others. What if I miss a dose? If you miss a dose, use it as soon as you remember. If it is almost time for your next dose, use only that dose  and continue with your regular schedule. Do not use double or extra doses. What may interact with this medicine?  certain antibiotics like clarithromycin and telithromycin  certain medicines for fungal infections like ketoconazole, itraconazole, and voriconazole  conivaptan  nefazodone  some medicines for HIV  vaccines This list may not describe all possible interactions. Give your health care provider a list of all the medicines, herbs, non-prescription drugs, or dietary supplements you use. Also tell them if you smoke, drink alcohol, or use illegal drugs. Some items may interact with your  medicine. What should I watch for while using this medicine? Visit your healthcare professional for regular checks on your progress. Tell your healthcare professional if your symptoms do not start to get better or if they get worse. This medicine may increase your risk of getting an infection. Tell your doctor or health care professional if you are around anyone with measles or chickenpox, or if you develop sores or blisters that do not heal properly. What side effects may I notice from receiving this medicine? Side effects that you should report to your doctor or health care professional as soon as possible:  allergic reactions like skin rash, itching or hives, swelling of the face, lips, or tongue  changes in vision  crusting or sores in the nose  nosebleed  signs and symptoms of infection like fever or chills; cough; sore throat  white patches or sores in the mouth or nose Side effects that usually do not require medical attention (report to your doctor or health care professional if they continue or are bothersome):  burning or irritation inside the nose or throat  changes in taste or smell  cough  headache This list may not describe all possible side effects. Call your doctor for medical advice about side effects. You may report side effects to FDA at 1-800-FDA-1088. Where should I keep my medicine? Keep out of the reach of children. Store at room temperature between 15 and 30 degrees C (59 and 86 degrees F). Avoid exposure to extreme heat, cold, or light. Throw away any unused medicine after the expiration date. NOTE: This sheet is a summary. It may not cover all possible information. If you have questions about this medicine, talk to your doctor, pharmacist, or health care provider.  2020 Elsevier/Gold Standard (2017-03-23 14:10:08)

## 2020-02-13 NOTE — Progress Notes (Signed)
MyChart Video Visit    Virtual Visit via Video Note   This visit type was conducted due to national recommendations for restrictions regarding the COVID-19 Pandemic (e.g. social distancing) in an effort to limit this patient's exposure and mitigate transmission in our community. This patient is at least at moderate risk for complications without adequate follow up. This format is felt to be most appropriate for this patient at this time. Physical exam was limited by quality of the video and audio technology used for the visit.   Parties involved in visit as below:   Patient location: at home  Provider location: Provider: Provider's office at  [facility], [place].     I discussed the limitations of evaluation and management by telemedicine and the availability of in person appointments. The patient expressed understanding and agreed to proceed.  Patient: Lawrence Andrews   DOB: Nov 03, 1985   34 y.o. Male  MRN: [id number] Visit Date: 02/13/2020  Today's healthcare provider: Jairo Ben, FNP   No chief complaint on file.  Subjective    Fever  This is a new problem. The current episode started 1 to 4 weeks ago. The problem occurs daily. The problem has been gradually improving. The maximum temperature noted was 100 to 100.9 F. Associated symptoms include congestion, ear pain, headaches and a sore throat. Pertinent negatives include no abdominal pain, chest pain, coughing, diarrhea, muscle aches, nausea, rash, sleepiness, vomiting or wheezing. The treatment provided no relief.   Headache has improved some.  He thought the Wellbutrin was causing a headache but now feels he was just getting sick.  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  Tested negative today covid.   Negative rapid covid test done today.  Does have loss of smell mild. Can taste.   Patient  denies any fever, body aches,chills, rash, chest pain, shortness of breath, nausea, vomiting, or  diarrhea.    Work note - needs for 12/1 to 02/18/20.    Medications: Outpatient Medications Prior to Visit  Medication Sig  . buPROPion (WELLBUTRIN XL) 300 MG 24 hr tablet TAKE 1 TABLET BY MOUTH EVERY DAY  . cetirizine (ZYRTEC ALLERGY) 10 MG tablet Take 1 tablet (10 mg total) by mouth daily. (Patient not taking: Reported on 12/24/2019)  . clonazePAM (KLONOPIN) 0.5 MG tablet Take 1 tablet (0.5 mg total) by mouth 2 (two) times daily as needed for anxiety (only as needed).  . cyclobenzaprine (FLEXERIL) 10 MG tablet Take 1 tablet (10 mg total) by mouth at bedtime as needed for muscle spasms.  . famotidine (PEPCID) 20 MG tablet Take 1 tablet (20 mg total) by mouth 2 (two) times daily. (Patient not taking: Reported on 12/24/2019)  . levocetirizine (XYZAL) 5 MG tablet Take 1 tablet (5 mg total) by mouth every evening. (Patient not taking: Reported on 12/24/2019)  . pantoprazole (PROTONIX) 40 MG tablet Take 40 mg by mouth daily. (Patient not taking: Reported on 12/24/2019)  . Vitamin D, Ergocalciferol, (DRISDOL) 1.25 MG (50000 UNIT) CAPS capsule Take 1 capsule (50,000 Units total) by mouth every 7 (seven) days.   No facility-administered medications prior to visit.    Review of Systems  Constitutional: Positive for fever.  HENT: Positive for congestion, ear pain and sore throat.   Respiratory: Negative for cough and wheezing.   Cardiovascular: Negative for chest pain.  Gastrointestinal: Negative for abdominal pain, diarrhea, nausea and vomiting.  Skin: Negative for rash.  Neurological: Positive for headaches.  Objective    There were no vitals taken for this visit.  No vitals available.  Physical Exam    Patient is alert and oriented and responsive to questions Engages in conversation with provider. Speaks in full sentences without any pauses without any shortness of breath or distress.    Assessment & Plan     Acute non-recurrent pansinusitis - Plan: COVID-19, Flu A+B and  RSV, Culture, Group A Strep, Culture, Group A Strep, COVID-19, Flu A+B and RSV  Sinus pressure - Plan: COVID-19, Flu A+B and RSV, COVID-19, Flu A+B and RSV  Otalgia, unspecified laterality - Plan: Culture, Group A Strep, Culture, Group A Strep  Sorethroat  Will come for parking lot testing at 430 in parking lot per parking lot testing protocol given. Will test on 02/14/2020. Orders Placed This Encounter  Procedures  . COVID-19, Flu A+B and RSV  . Culture, Group A Strep   Meds ordered this encounter  Medications  . amoxicillin-clavulanate (AUGMENTIN) 875-125 MG tablet    Sig: Take 1 tablet by mouth 2 (two) times daily.    Dispense:  20 tablet    Refill:  0  . predniSONE (STERAPRED UNI-PAK 21 TAB) 10 MG (21) TBPK tablet    Sig: PO: Take 6 tablets on day 1:Take 5 tablets day 2:Take 4 tablets day 3: Take 3 tablets day 4:Take 2 tablets day five: 5 Take 1 tablet day 6    Dispense:  21 tablet    Refill:  0  . fluticasone (FLONASE) 50 MCG/ACT nasal spray    Sig: Place 2 sprays into both nostrils daily.    Dispense:  16 g    Refill:  0   Red Flags discussed. The patient was given clear instructions to go to ER or return to medical center if any red flags develop, symptoms do not improve, worsen or new problems develop. They verbalized understanding.   Return if symptoms worsen or fail to improve, for at any time for any worsening symptoms, Go to Emergency room/ urgent care if worse.     I discussed the assessment and treatment plan with the patient. The patient was provided an opportunity to ask questions and all were answered. The patient agreed with the plan and demonstrated an understanding of the instructions.   The patient was advised to call back or seek an in-person evaluation if the symptoms worsen or if the condition fails to improve as anticipated.  I provided 25 minutes of non-face-to-face time during this encounter.  I discussed the limitations of evaluation and management  by telemedicine and the availability of in person appointments. The patient expressed understanding and agreed to proceed.   Jairo Ben, FNP [facility] [phone] (phone) [phone] (fax)  [facility]

## 2020-02-13 NOTE — Telephone Encounter (Signed)
flinCopied from CRM [id number]. Topic: General - Other >> Feb 13, 2020  2:10 PM Jaquita Rector A wrote: Reason for CRM: Lawson Fiscal with [facility] called in to ask for a copy of patients health insurance card asking for it to be faxed to fax# [phone] Ph# [phone]

## 2020-02-14 NOTE — Telephone Encounter (Signed)
Demographic sheet has been faxed. KW

## 2020-03-02 ENCOUNTER — Ambulatory Visit: Payer: Managed Care, Other (non HMO) | Admitting: Adult Health

## 2020-03-05 ENCOUNTER — Other Ambulatory Visit: Payer: Self-pay | Admitting: Adult Health

## 2020-03-05 NOTE — Telephone Encounter (Signed)
Requested medications are due for refill today yes  Requested medications are on the active medication list yes  Last refill 12/2  Last visit 12/1  Notes to clinic Was just seen 12/1 and only given a short supply, unsure if was to be continued.

## 2020-03-17 ENCOUNTER — Encounter: Payer: Self-pay | Admitting: Adult Health

## 2020-03-17 ENCOUNTER — Telehealth: Payer: Managed Care, Other (non HMO) | Admitting: Adult Health

## 2020-03-23 ENCOUNTER — Telehealth: Payer: Managed Care, Other (non HMO) | Admitting: Adult Health

## 2020-03-25 ENCOUNTER — Telehealth: Payer: Self-pay

## 2020-03-25 NOTE — Telephone Encounter (Signed)
Copied from CRM [id number]. Topic: General - Other >> Mar 25, 2020  1:44 PM Jaquita Rector A wrote: Reason for CRM: Patient would like a call back to schedule a covid test please at Ph# [phone]

## 2020-03-26 ENCOUNTER — Other Ambulatory Visit: Payer: Managed Care, Other (non HMO)

## 2020-03-26 ENCOUNTER — Other Ambulatory Visit: Payer: Self-pay

## 2020-03-26 DIAGNOSIS — Z20822 Contact with and (suspected) exposure to covid-19: Secondary | ICD-10-CM

## 2020-03-28 LAB — NOVEL CORONAVIRUS, NAA: SARS-CoV-2, NAA: DETECTED — AB

## 2020-03-28 LAB — SARS-COV-2, NAA 2 DAY TAT

## 2020-03-29 ENCOUNTER — Other Ambulatory Visit: Payer: Self-pay | Admitting: Adult Health

## 2020-03-29 MED ORDER — AMOXICILLIN-POT CLAVULANATE 875-125 MG PO TABS: 1.0000 | ORAL_TABLET | Freq: Two times a day (BID) | ORAL | 0 refills | Status: DC

## 2020-03-29 MED ORDER — ALBUTEROL SULFATE HFA 108 (90 BASE) MCG/ACT IN AERS: 2.0000 | INHALATION_SPRAY | Freq: Four times a day (QID) | RESPIRATORY_TRACT | 0 refills | Status: DC | PRN

## 2020-03-29 MED ORDER — PREDNISONE 10 MG (21) PO TBPK: ORAL_TABLET | ORAL | 0 refills | Status: DC

## 2020-03-29 MED ORDER — BENZONATATE 100 MG PO CAPS: 100.0000 mg | ORAL_CAPSULE | Freq: Two times a day (BID) | ORAL | 0 refills | Status: DC | PRN

## 2020-03-30 ENCOUNTER — Telehealth: Payer: Managed Care, Other (non HMO) | Admitting: Adult Health

## 2020-03-31 ENCOUNTER — Telehealth (INDEPENDENT_AMBULATORY_CARE_PROVIDER_SITE_OTHER): Payer: Managed Care, Other (non HMO) | Admitting: Adult Health

## 2020-03-31 ENCOUNTER — Encounter: Payer: Self-pay | Admitting: Adult Health

## 2020-03-31 DIAGNOSIS — J019 Acute sinusitis, unspecified: Secondary | ICD-10-CM

## 2020-03-31 DIAGNOSIS — J40 Bronchitis, not specified as acute or chronic: Secondary | ICD-10-CM

## 2020-03-31 DIAGNOSIS — U071 COVID-19: Secondary | ICD-10-CM

## 2020-03-31 HISTORY — DX: COVID-19: U07.1

## 2020-03-31 MED ORDER — PANTOPRAZOLE SODIUM 40 MG PO TBEC: 40.0000 mg | DELAYED_RELEASE_TABLET | Freq: Every day | ORAL | 0 refills | Status: DC

## 2020-03-31 NOTE — Patient Instructions (Signed)
Acute Bronchitis, Adult  Acute bronchitis is when air tubes in the lungs (bronchi) suddenly get swollen. The condition can make it hard for you to breathe. In adults, acute bronchitis usually goes away within 2 weeks. A cough caused by bronchitis may last up to 3 weeks. Smoking, allergies, and asthma can make the condition worse. What are the causes? This condition is caused by:  Cold and flu viruses. The most common cause of this condition is the virus that causes the common cold.  Bacteria.  Substances that irritate the lungs, including: ? Smoke from cigarettes and other types of tobacco. ? Dust and pollen. ? Fumes from chemicals, gases, or burned fuel. ? Other materials that pollute indoor or outdoor air.  Close contact with someone who has acute bronchitis. What increases the risk? The following factors may make you more likely to develop this condition:  A weak body's defense system. This is also called the immune system.  Any condition that affects your lungs and breathing, such as asthma. What are the signs or symptoms? Symptoms of this condition include:  A cough.  Coughing up clear, yellow, or green mucus.  Wheezing.  Chest congestion.  Shortness of breath.  A fever.  Body aches.  Chills.  A sore throat. How is this treated? Acute bronchitis may go away over time without treatment. Your doctor may recommend:  Drinking more fluids.  Taking a medicine for a fever or cough.  Using a device that gets medicine into your lungs (inhaler).  Using a vaporizer or a humidifier. These are machines that add water or moisture in the air to help with coughing and poor breathing. Follow these instructions at home: Activity  Get a lot of rest.  Avoid places where there are fumes from chemicals.  Return to your normal activities as told by your doctor. Ask your doctor what activities are safe for you. Lifestyle  Drink enough fluids to keep your pee (urine) pale  yellow.  Do not drink alcohol.  Do not use any products that contain nicotine or tobacco, such as cigarettes, e-cigarettes, and chewing tobacco. If you need help quitting, ask your doctor. Be aware that: ? Your bronchitis will get worse if you smoke or breathe in other people's smoke (secondhand smoke). ? Your lungs will heal faster if you quit smoking. General instructions  Take over-the-counter and prescription medicines only as told by your doctor.  Use an inhaler, cool mist vaporizer, or humidifier as told by your doctor.  Rinse your mouth often with salt water. To make salt water, dissolve -1 tsp (3-6 g) of salt in 1 cup (237 mL) of warm water.  Keep all follow-up visits as told by your doctor. This is important.   How is this prevented? To lower your risk of getting this condition again:  Wash your hands often with soap and water. If soap and water are not available, use hand sanitizer.  Avoid contact with people who have cold symptoms.  Try not to touch your mouth, nose, or eyes with your hands.  Make sure to get the flu shot every year.   Contact a doctor if:  Your symptoms do not get better in 2 weeks.  You vomit more than once or twice.  You have symptoms of loss of fluid from your body (dehydration). These include: ? Dark urine. ? Dry skin or eyes. ? Increased thirst. ? Headaches. ? Confusion. ? Muscle cramps. Get help right away if:  You cough up blood.    You have chest pain.  You have very bad shortness of breath.  You become dehydrated.  You faint or keep feeling like you are going to faint.  You keep vomiting.  You have a very bad headache.  Your fever or chills get worse. These symptoms may be an emergency. Do not wait to see if the symptoms will go away. Get medical help right away. Call your local emergency services (911 in the [place] Do not drive yourself to the hospital. Summary  Acute bronchitis is when air tubes in the lungs (bronchi)  suddenly get swollen. In adults, acute bronchitis usually goes away within 2 weeks.  Take over-the-counter and prescription medicines only as told by your doctor.  Drink enough fluid to keep your pee (urine) pale yellow.  Contact a doctor if your symptoms do not improve after 2 weeks of treatment.  Get help right away if you cough up blood, faint, or have chest pain or shortness of breath. This information is not intended to replace advice given to you by your health care provider. Make sure you discuss any questions you have with your health care provider. Document Revised: 09/21/2018 Document Reviewed: 09/21/2018 Elsevier Patient Education  2021 Else[employer] Inc. COVID-19: What to Do if You Are Sick If you have a fever, cough or other symptoms, you might have COVID-19. Most people have mild illness and are able to recover at home. If you are sick:  Keep track of your symptoms.  If you have an emergency warning sign (including trouble breathing), call 911. Steps to help prevent the spread of COVID-19 if you are sick If you are sick with COVID-19 or think you might have COVID-19, follow the steps below to care for yourself and to help protect other people in your home and community. Stay home except to get medical care  Stay home. Most people with COVID-19 have mild illness and can recover at home without medical care. Do not leave your home, except to get medical care. Do not visit public areas.  Take care of yourself. Get rest and stay hydrated. Take over-the-counter medicines, such as acetaminophen, to help you feel better.  Stay in touch with your doctor. Call before you get medical care. Be sure to get care if you have trouble breathing, or have any other emergency warning signs, or if you think it is an emergency.  Avoid public transportation, ride-sharing, or taxis. Separate yourself from other people As much as possible, stay in a specific room and away from other people and pets  in your home. If possible, you should use a separate bathroom. If you need to be around other people or animals in or outside of the home, wear a mask. Tell your close contactsthat they may have been exposed to COVID-19. An infected person can spread COVID-19 starting 48 hours (or 2 days) before the person has any symptoms or tests positive. By letting your close contacts know they may have been exposed to COVID-19, you are helping to protect everyone.  Additional guidance is available for those living in close quarters and shared housing.  See COVID-19 and Animals if you have questions about pets.  If you are diagnosed with COVID-19, someone from the health department may call you. Answer the call to slow the spread. Monitor your symptoms  Symptoms of COVID-19 include fever, cough, or other symptoms.  Follow care instructions from your healthcare provider and local health department. Your local health authorities may give instructions on checking your symptoms  and reporting information. When to seek emergency medical attention Look for emergency warning signs* for COVID-19. If someone is showing any of these signs, seek emergency medical care immediately:  Trouble breathing  Persistent pain or pressure in the chest  New confusion  Inability to wake or stay awake  Pale, gray, or blue-colored skin, lips, or nail beds, depending on skin tone *This list is not all possible symptoms. Please call your medical provider for any other symptoms that are severe or concerning to you. Call 911 or call ahead to your local emergency facility: Notify the operator that you are seeking care for someone who has or may have COVID-19. Call ahead before visiting your doctor  Call ahead. Many medical visits for routine care are being postponed or done by phone or telemedicine.  If you have a medical appointment that cannot be postponed, call your doctor's office, and tell them you have or may have COVID-19.  This will help the office protect themselves and other patients. Get  tested  If you have symptoms of COVID-19, get tested. While waiting for test results, you stay away from others, including staying apart from those living in your household.  You can visit your state, tribal, local, and territorialhealth department's website to look for the latest local information on testing sites. If you are sick, wear a mask over your nose and mouth  You should wear a mask over your nose and mouth if you must be around other people or animals, including pets (even at home).  You don't need to wear the mask if you are alone. If you can't put on a mask (because of trouble breathing, for example), cover your coughs and sneezes in some other way. Try to stay at least 6 feet away from other people. This will help protect the people around you.  Masks should not be placed on young children under age 21 years, anyone who has trouble breathing, or anyone who is not able to remove the mask without help. Note: During the COVID-19 pandemic, medical grade facemasks are reserved for healthcare workers and some first responders. Cover your coughs and sneezes  Cover your mouth and nose with a tissue when you cough or sneeze.  Throw away used tissues in a lined trash can.  Immediately wash your hands with soap and water for at least 20 seconds. If soap and water are not available, clean your hands with an alcohol-based hand sanitizer that contains at least 60% alcohol. Clean your hands often  Wash your hands often with soap and water for at least 20 seconds. This is especially important after blowing your nose, coughing, or sneezing; going to the bathroom; and before eating or preparing food.  Use hand sanitizer if soap and water are not available. Use an alcohol-based hand sanitizer with at least 60% alcohol, covering all surfaces of your hands and rubbing them together until they feel dry.  Soap and water are the  best option, especially if hands are visibly dirty.  Avoid touching your eyes, nose, and mouth with unwashed hands.  Handwashing Tips Avoid sharing personal household items  Do not share dishes, drinking glasses, cups, eating utensils, towels, or bedding with other people in your home.  Wash these items thoroughly after using them with soap and water or put in the dishwasher. Clean all "high-touch" surfaces everyday  Clean and disinfect high-touch surfaces in your "sick room" and bathroom; wear disposable gloves. Let someone else clean and disinfect surfaces in common areas,  but you should clean your bedroom and bathroom, if possible.  If a caregiver or other person needs to clean and disinfect a sick person's bedroom or bathroom, they should do so on an as-needed basis. The caregiver/other person should wear a mask and disposable gloves prior to cleaning. They should wait as long as possible after the person who is sick has used the bathroom before coming in to clean and use the bathroom. ? High-touch surfaces include phones, remote controls, counters, tabletops, doorknobs, bathroom fixtures, toilets, keyboards, tablets, and bedside tables.  Clean and disinfect areas that may have blood, stool, or body fluids on them.  Use household cleaners and disinfectants. Clean the area or item with soap and water or another detergent if it is dirty. Then, use a household disinfectant. ? Be sure to follow the instructions on the label to ensure safe and effective use of the product. Many products recommend keeping the surface wet for several minutes to ensure germs are killed. Many also recommend precautions such as wearing gloves and making sure you have good ventilation during use of the product. ? Use a product from [employer] List N: Disinfectants for Coronavirus (COVID-19). ? Complete Disinfection Guidance When you can be around others after being sick with COVID-19 Deciding when you can be around  others is different for different situations. Find out when you can safely end home isolation. For any additional questions about your care, contact your healthcare provider or state or local health department. 05/29/2019 Content source: [facility] Immunization and [facility] Diseases ([facility]), Division of Viral Diseases This information is not intended to replace advice given to you by your health care provider. Make sure you discuss any questions you have with your health care provider. Document Revised: 01/13/2020 Document Reviewed: 01/13/2020 Elsevier Patient Education  2021 [employer].

## 2020-03-31 NOTE — Progress Notes (Signed)
MyChart Video Visit    Virtual Visit via Video Note   This visit type was conducted due to national recommendations for restrictions regarding the COVID-19 Pandemic (e.g. social distancing) in an effort to limit this patient's exposure and mitigate transmission in our community. This patient is at least at moderate risk for complications without adequate follow up. This format is felt to be most appropriate for this patient at this time. Physical exam was limited by quality of the video and audio technology used for the visit.  Parties involved in visit as below:    Patient location: at home  Provider location: Provider: Provider's office at  [facility], [place].   I discussed the limitations of evaluation and management by telemedicine and the availability of in person appointments. The patient expressed understanding and agreed to proceed.  Patient: Lawrence Andrews   DOB: 1985-12-25   35 y.o. Male  MRN: [id number] Visit Date: 03/31/2020  Today's healthcare provider: Jairo Ben, FNP   Chief Complaint  Patient presents with  . Follow-up   Subjective    HPI  Follow up for Covid-19 Patient presents today after testing positive for Covid on 03/26/20. Patient reports that he has symptoms of cough, sore throat, sinus pain and pressure, dyspnea congestion and runny nose. Patient denied fever, chest pain and headache. Patient has been taking otc Tylenol Sinus Cold and flu with mild relief.   He is improving with the Augmentin, and steroid and using the inhaler. Denies any fever.  He does have fatigue with Covid.  Patient  denies any fever, body aches,chills, rash, chest pain, shortness of breath, nausea, vomiting, or diarrhea.    He held off on seeing neurosurgeon.  He was referred to for radiculopathy, I do advise he see them given his spinal x rays. He agrees he will.   Patient  denies any fever, body aches,chills, rash, chest pain, shortness of  breath, nausea, vomiting, or diarrhea.  Denies dizziness, lightheadedness, pre syncopal or syncopal episodes.   -----------------------------------------------------------------------------------------  Patient Active Problem List   Diagnosis Date Noted  . COVID-19 03/31/2020  . Bronchitis 03/31/2020  . Acute non-recurrent sinusitis 02/13/2020  . Sorethroat 02/13/2020  . Otalgia 02/13/2020  . Sinus pressure 02/13/2020  . Radicular pain in right arm 01/28/2020  . Chronic midline back pain 01/28/2020  . Muscle spasm 12/24/2019  . Allergy to alpha-gal 10/14/2019  . Other allergic rhinitis 10/14/2019  . Anxiety 07/16/2019  . Body mass index (BMI) of 32.0-32.9 in adult 07/16/2019  . Skin yeast infection 07/16/2019  . Fatigue 07/16/2019  . Vitamin B12 deficiency 07/16/2019  . History of panic attacks 07/16/2019  . Screening for STD (sexually transmitted disease) 07/16/2019  . Recurrent urticaria 04/16/2019  . Angioedema 04/16/2019  . Epistaxis 04/16/2019  . Allergic reaction 04/16/2019  . Palpitations 02/27/2019  . Hyperlipidemia, mixed 02/27/2019  . Snoring 07/23/2018  . Low testosterone 07/23/2018  . Hypotestosteronemia in male 07/07/2017  . Vitamin D deficiency 07/05/2017  . Tinea versicolor 07/05/2017  . Gastroesophageal reflux disease without esophagitis 02/24/2017  . Chewing tobacco nicotine dependence without complication 02/24/2017  . Moderate episode of recurrent major depressive disorder (HCC) 08/10/2016  . Panic attacks 05/20/2016  . Generalized anxiety disorder 05/20/2016  . Essential hypertension 05/20/2016   Past Medical History:  Diagnosis Date  . Allergy   . Allergy to alpha-gal   . Anxiety   . Depression   . Hypertension    Allergies  Allergen Reactions  .  Alpha-Gal     Red meat  . Other     Fire ants and pollen      Medications: Outpatient Medications Prior to Visit  Medication Sig  . albuterol (VENTOLIN HFA) 108 (90 Base) MCG/ACT inhaler  Inhale 2 puffs into the lungs every 6 (six) hours as needed for wheezing or shortness of breath.  Marland Kitchen amoxicillin-clavulanate (AUGMENTIN) 875-125 MG tablet Take 1 tablet by mouth 2 (two) times daily.  . benzonatate (TESSALON) 100 MG capsule Take 1 capsule (100 mg total) by mouth 2 (two) times daily as needed for cough.  . cyclobenzaprine (FLEXERIL) 10 MG tablet Take 1 tablet (10 mg total) by mouth at bedtime as needed for muscle spasms.  . predniSONE (STERAPRED UNI-PAK 21 TAB) 10 MG (21) TBPK tablet PO: Take 6 tablets on day 1:Take 5 tablets day 2:Take 4 tablets day 3: Take 3 tablets day 4:Take 2 tablets day five: 5 Take 1 tablet day 6  . Vitamin D, Ergocalciferol, (DRISDOL) 1.25 MG (50000 UNIT) CAPS capsule Take 1 capsule (50,000 Units total) by mouth every 7 (seven) days.  . [DISCONTINUED] buPROPion (WELLBUTRIN XL) 300 MG 24 hr tablet TAKE 1 TABLET BY MOUTH EVERY DAY  . [DISCONTINUED] cetirizine (ZYRTEC ALLERGY) 10 MG tablet Take 1 tablet (10 mg total) by mouth daily. (Patient not taking: Reported on 12/24/2019)  . [DISCONTINUED] clonazePAM (KLONOPIN) 0.5 MG tablet Take 1 tablet (0.5 mg total) by mouth 2 (two) times daily as needed for anxiety (only as needed).  . [DISCONTINUED] famotidine (PEPCID) 20 MG tablet Take 1 tablet (20 mg total) by mouth 2 (two) times daily. (Patient not taking: Reported on 12/24/2019)  . [DISCONTINUED] fluticasone (FLONASE) 50 MCG/ACT nasal spray SPRAY 2 SPRAYS INTO EACH NOSTRIL EVERY DAY  . [DISCONTINUED] levocetirizine (XYZAL) 5 MG tablet Take 1 tablet (5 mg total) by mouth every evening. (Patient not taking: Reported on 12/24/2019)  . [DISCONTINUED] pantoprazole (PROTONIX) 40 MG tablet Take 40 mg by mouth daily. (Patient not taking: Reported on 12/24/2019)   No facility-administered medications prior to visit.    Review of Systems  Constitutional: Positive for fatigue. Negative for activity change, appetite change, chills, diaphoresis, fever and unexpected weight  change.  HENT: Positive for congestion, postnasal drip, rhinorrhea, sinus pressure, sinus pain and sore throat. Negative for dental problem, drooling, ear discharge, ear pain, facial swelling, hearing loss, mouth sores, nosebleeds, sneezing, tinnitus, trouble swallowing and voice change.   Eyes: Negative.   Respiratory: Positive for cough. Negative for apnea, choking, chest tightness, shortness of breath, wheezing and stridor.   Cardiovascular: Negative.   Gastrointestinal: Negative.   Genitourinary: Negative.   Musculoskeletal: Negative.   Skin: Negative.   Neurological: Negative.   Psychiatric/Behavioral: Negative.       Objective    There were no vitals taken for this visit.   Physical Exam    Patient is alert and oriented and responsive to questions Engages in conversation with provider. Speaks in full sentences without any pauses without any shortness of breath or distress.   No vital signs available.   Assessment & Plan     COVID-19  Bronchitis  Acute non-recurrent sinusitis, unspecified location  Continue current prescriptions, seek care if any symptoms persisting or worsneing or fatigue not improving. Rest and hydrate  Red Flags discussed. The patient was given clear instructions to go to ER or return to medical center if any red flags develop, symptoms do not improve, worsen or new problems develop. They verbalized understanding.  Return in about 3 months (around 06/29/2020), or if symptoms worsen or fail to improve, for at any time for any worsening symptoms, Go to Emergency room/ urgent care if worse.     I discussed the assessment and treatment plan with the patient. The patient was provided an opportunity to ask questions and all were answered. The patient agreed with the plan and demonstrated an understanding of the instructions.   The patient was advised to call back or seek an in-person evaluation if the symptoms worsen or if the condition fails to  improve as anticipated.  I provided 30  minutes of non-face-to-face time during this encounter.  The entirety of the information documented in the History of Present Illness, Review of Systems and Physical Exam were personally obtained by me. Portions of this information were initially documented by the CMA and reviewed by me for thoroughness and accuracy.    I discussed the limitations of evaluation and management by telemedicine and the availability of in person appointments. The patient expressed understanding and agreed to proceed.   Jairo Ben, FNP [facility] [phone] (phone) [phone] (fax)  [facility]

## 2020-04-16 ENCOUNTER — Other Ambulatory Visit: Payer: Self-pay | Admitting: Adult Health

## 2020-04-16 NOTE — Telephone Encounter (Signed)
No refill to early, patient should schedule visit if symptoms worsening.

## 2020-04-16 NOTE — Telephone Encounter (Signed)
  Notes to clinic Requesting inhaler too soon, please assess.

## 2020-04-16 NOTE — Telephone Encounter (Signed)
This was filled 2 weeks ago and should have lasted 1 month

## 2020-05-07 ENCOUNTER — Other Ambulatory Visit: Payer: Self-pay

## 2020-05-07 ENCOUNTER — Emergency Department
Admission: EM | Admit: 2020-05-07 | Discharge: 2020-05-08 | Disposition: A | Discharge status: Home / Self Care | Payer: Managed Care, Other (non HMO) | Attending: Emergency Medicine | Admitting: Emergency Medicine

## 2020-05-07 DIAGNOSIS — Z8616 Personal history of COVID-19: Secondary | ICD-10-CM | POA: Insufficient documentation

## 2020-05-07 DIAGNOSIS — R002 Palpitations: Secondary | ICD-10-CM | POA: Diagnosis not present

## 2020-05-07 DIAGNOSIS — I10 Essential (primary) hypertension: Secondary | ICD-10-CM | POA: Diagnosis not present

## 2020-05-07 DIAGNOSIS — R232 Flushing: Secondary | ICD-10-CM | POA: Diagnosis not present

## 2020-05-07 DIAGNOSIS — R42 Dizziness and giddiness: Secondary | ICD-10-CM | POA: Insufficient documentation

## 2020-05-07 DIAGNOSIS — F17220 Nicotine dependence, chewing tobacco, uncomplicated: Secondary | ICD-10-CM | POA: Diagnosis not present

## 2020-05-07 LAB — URINE DRUG SCREEN, QUALITATIVE (ARMC ONLY)
Amphetamines, Ur Screen: NOT DETECTED
Barbiturates, Ur Screen: NOT DETECTED
Benzodiazepine, Ur Scrn: NOT DETECTED
Cannabinoid 50 Ng, Ur ~~LOC~~: NOT DETECTED
Cocaine Metabolite,Ur ~~LOC~~: NOT DETECTED
MDMA (Ecstasy)Ur Screen: NOT DETECTED
Methadone Scn, Ur: NOT DETECTED
Opiate, Ur Screen: NOT DETECTED
Phencyclidine (PCP) Ur S: NOT DETECTED
Tricyclic, Ur Screen: NOT DETECTED

## 2020-05-07 LAB — COMPREHENSIVE METABOLIC PANEL
ALT: 23 U/L (ref 0–44)
AST: 24 U/L (ref 15–41)
Albumin: 4.7 g/dL (ref 3.5–5.0)
Alkaline Phosphatase: 62 U/L (ref 38–126)
Anion gap: 12 (ref 5–15)
BUN: 28 mg/dL — ABNORMAL HIGH (ref 6–20)
CO2: 24 mmol/L (ref 22–32)
Calcium: 9.2 mg/dL (ref 8.9–10.3)
Chloride: 102 mmol/L (ref 98–111)
Creatinine, Ser: 1.25 mg/dL — ABNORMAL HIGH (ref 0.61–1.24)
GFR, Estimated: >60 mL/min (ref >60)
Glucose, Bld: 86 mg/dL (ref 70–99)
Potassium: 3.2 mmol/L — ABNORMAL LOW (ref 3.5–5.1)
Sodium: 138 mmol/L (ref 135–145)
Total Bilirubin: 1 mg/dL (ref 0.3–1.2)
Total Protein: 7.6 g/dL (ref 6.5–8.1)

## 2020-05-07 LAB — CBC
HCT: 48.9 % (ref 39.0–52.0)
Hemoglobin: 16.8 g/dL (ref 13.0–17.0)
MCH: 31.1 pg (ref 26.0–34.0)
MCHC: 34.4 g/dL (ref 30.0–36.0)
MCV: 90.4 fL (ref 80.0–100.0)
Platelets: 220 10*3/uL (ref 150–400)
RBC: 5.41 MIL/uL (ref 4.22–5.81)
RDW: 11.9 % (ref 11.5–15.5)
WBC: 8.1 10*3/uL (ref 4.0–10.5)
nRBC: 0 % (ref 0.0–0.2)

## 2020-05-07 LAB — TROPONIN I (HIGH SENSITIVITY)
Troponin I (High Sensitivity): 3 ng/L (ref <18)
Troponin I (High Sensitivity): 3 ng/L (ref <18)

## 2020-05-07 NOTE — ED Triage Notes (Addendum)
Pt in with co palpitations, htn and dizziness for 1 hr pta. Pt takes klonopin regularly but no hx of the same. Pt was started on Buspar yesterday for anxiety.

## 2020-05-08 ENCOUNTER — Other Ambulatory Visit: Payer: Self-pay | Admitting: Adult Health

## 2020-05-08 NOTE — ED Provider Notes (Signed)
[facility] Emergency Department Provider Note   ____________________________________________   Event Date/Time   First MD Initiated Contact with Patient 05/08/20 0004     (approximate)  I have reviewed the triage vital signs and the nursing notes.   HISTORY  Chief Complaint Hypertension and Palpitations    HPI Lawrence Andrews is a 35 y.o. male with a stated past medical history of anxiety/depression, hypertension, and an allergy to red meat who presents for palpitations, hypertension, and dizziness for 1 hour prior to arrival.  Patient states that he does have symptoms similar to this in the past that were due to dehydration/anxiety and resolved spontaneously.  Patient states that after having "2 beers" prior to arrival he began having palpitations, central chest tightness/burning, flushing in his face and ears as well as a throbbing headache.  Patient also states that his heart rate was up to the 110s.  Patient denies any exertional change in this chest pain.  Patient currently denies any vision changes, tinnitus, difficulty speaking, facial droop, sore throat, shortness of breath, abdominal pain, nausea/vomiting/diarrhea, dysuria, or weakness/numbness in any extremity         Past Medical History:  Diagnosis Date  . Allergy   . Allergy to alpha-gal   . Anxiety   . Depression   . Hypertension     Patient Active Problem List   Diagnosis Date Noted  . COVID-19 03/31/2020  . Bronchitis 03/31/2020  . Acute non-recurrent sinusitis 02/13/2020  . Sorethroat 02/13/2020  . Otalgia 02/13/2020  . Sinus pressure 02/13/2020  . Radicular pain in right arm 01/28/2020  . Chronic midline back pain 01/28/2020  . Muscle spasm 12/24/2019  . Allergy to alpha-gal 10/14/2019  . Other allergic rhinitis 10/14/2019  . Anxiety 07/16/2019  . Body mass index (BMI) of 32.0-32.9 in adult 07/16/2019  . Skin yeast infection 07/16/2019  . Fatigue 07/16/2019  . Vitamin B12  deficiency 07/16/2019  . History of panic attacks 07/16/2019  . Screening for STD (sexually transmitted disease) 07/16/2019  . Recurrent urticaria 04/16/2019  . Angioedema 04/16/2019  . Epistaxis 04/16/2019  . Allergic reaction 04/16/2019  . Palpitations 02/27/2019  . Hyperlipidemia, mixed 02/27/2019  . Snoring 07/23/2018  . Low testosterone 07/23/2018  . Hypotestosteronemia in male 07/07/2017  . Vitamin D deficiency 07/05/2017  . Tinea versicolor 07/05/2017  . Gastroesophageal reflux disease without esophagitis 02/24/2017  . Chewing tobacco nicotine dependence without complication 02/24/2017  . Moderate episode of recurrent major depressive disorder (HCC) 08/10/2016  . Panic attacks 05/20/2016  . Generalized anxiety disorder 05/20/2016  . Essential hypertension 05/20/2016    Past Surgical History:  Procedure Laterality Date  . NO PAST SURGERIES      Prior to Admission medications   Medication Sig Start Date End Date Taking? Authorizing Provider  albuterol (VENTOLIN HFA) 108 (90 Base) MCG/ACT inhaler Inhale 2 puffs into the lungs every 6 (six) hours as needed for wheezing or shortness of breath. 03/29/20   Flinchum, Eula Fried, FNP  amoxicillin-clavulanate (AUGMENTIN) 875-125 MG tablet Take 1 tablet by mouth 2 (two) times daily. 03/29/20   Flinchum, Eula Fried, FNP  benzonatate (TESSALON) 100 MG capsule Take 1 capsule (100 mg total) by mouth 2 (two) times daily as needed for cough. 03/29/20   Flinchum, Eula Fried, FNP  cyclobenzaprine (FLEXERIL) 10 MG tablet Take 1 tablet (10 mg total) by mouth at bedtime as needed for muscle spasms. 12/24/19   Flinchum, Eula Fried, FNP  pantoprazole (PROTONIX) 40 MG tablet Take 1  tablet (40 mg total) by mouth daily. 03/31/20   Flinchum, Eula Fried, FNP  predniSONE (STERAPRED UNI-PAK 21 TAB) 10 MG (21) TBPK tablet PO: Take 6 tablets on day 1:Take 5 tablets day 2:Take 4 tablets day 3: Take 3 tablets day 4:Take 2 tablets day five: 5 Take 1 tablet day 6  03/29/20   Flinchum, Eula Fried, FNP  Vitamin D, Ergocalciferol, (DRISDOL) 1.25 MG (50000 UNIT) CAPS capsule Take 1 capsule (50,000 Units total) by mouth every 7 (seven) days. 01/28/20   Flinchum, Eula Fried, FNP    Allergies Alpha-gal and Other  Family History  Problem Relation Age of Onset  . Hypertension Mother   . Healthy Father   . Stroke Maternal Grandfather     Social History Social History   Tobacco Use  . Smoking status: Never Smoker  . Smokeless tobacco: Current User    Types: Chew  Vaping Use  . Vaping Use: Never used  Substance Use Topics  . Alcohol use: Yes    Comment: daily  . Drug use: No    Review of Systems Constitutional: No fever/chills Eyes: No visual changes. ENT: No sore throat. Cardiovascular: Endorses chest pain and palpitations Respiratory: Denies shortness of breath. Gastrointestinal: No abdominal pain.  No nausea, no vomiting.  No diarrhea. Genitourinary: Negative for dysuria. Musculoskeletal: Negative for acute arthralgias Skin: Negative for rash. Neurological: Positive for headaches, negative for weakness/numbness/paresthesias in any extremity Psychiatric: Negative for suicidal ideation/homicidal ideation   ____________________________________________   PHYSICAL EXAM:  VITAL SIGNS: ED Triage Vitals [05/07/20 2057]  Enc Vitals Group     BP (!) 143/105     Pulse Rate (!) 116     Resp 20     Temp 98.4 F (36.9 C)     Temp Source Oral     SpO2 98 %     Weight 250 lb (113.4 kg)     Height 6\' 2"  (1.88 m)     Head Circumference      Peak Flow      Pain Score 0     Pain Loc      Pain Edu?      Excl. in GC?    Constitutional: Alert and oriented. Well appearing and in no acute distress. Eyes: Conjunctivae are normal. PERRL. Head: Atraumatic. Nose: No congestion/rhinnorhea. Mouth/Throat: Mucous membranes are moist. Neck: No stridor Cardiovascular: Grossly normal heart sounds.  Good peripheral circulation. Respiratory: Normal  respiratory effort.  No retractions. Gastrointestinal: Soft and nontender. No distention. Musculoskeletal: No obvious deformities Neurologic:  Normal speech and language. No gross focal neurologic deficits are appreciated. Skin:  Skin is warm and dry. No rash noted. Psychiatric: Mood and affect are normal. Speech and behavior are normal.  ____________________________________________   LABS (all labs ordered are listed, but only abnormal results are displayed)  Labs Reviewed  COMPREHENSIVE METABOLIC PANEL - Abnormal; Notable for the following components:      Result Value   Potassium 3.2 (*)    BUN 28 (*)    Creatinine, Ser 1.25 (*)    All other components within normal limits  CBC  URINE DRUG SCREEN, QUALITATIVE ([facility] ONLY)  TROPONIN I (HIGH SENSITIVITY)  TROPONIN I (HIGH SENSITIVITY)   ____________________________________________  EKG  ED ECG REPORT I, , the attending physician, personally viewed and interpreted this ECG.  Date: 05/07/2020 EKG Time: 2053 Rate: 102 Rhythm: Tachycardic sinus rhythm QRS Axis: normal Intervals: normal ST/T Wave abnormalities: normal Narrative Interpretation: no evidence of acute ischemia  PROCEDURES  Procedure(s) performed (including Critical Care):  .1-3 Lead EKG Interpretation Performed by: Merwyn Katos, MD Authorized by: Merwyn Katos, MD     Interpretation: normal     ECG rate:  93   ECG rate assessment: normal     Rhythm: sinus rhythm     Ectopy: none     Conduction: normal       ____________________________________________   INITIAL IMPRESSION / ASSESSMENT AND PLAN / ED COURSE  As part of my medical decision making, I reviewed the following data within the electronic MEDICAL RECORD NUMBER Nursing notes reviewed and incorporated, Labs reviewed, EKG interpreted, Old chart reviewed, and Notes from prior ED visits reviewed and incorporated        Presents to the emergency department complaining of high  blood pressure chest pain and palpitations. Patient is otherwise asymptomatic without confusion, hematuria, or SOB.  DDx: CV, AMI, heart failure, renal infarction or failure or other end organ damage.  Disposition: Discussed with patient their elevated blood pressure and need for close outpatient management of their hypertension. Will arrange for the patient to follow up in a primary care clinic       ____________________________________________   FINAL CLINICAL IMPRESSION(S) / ED DIAGNOSES  Final diagnoses:  Primary hypertension  Palpitations  Facial flushing     ED Discharge Orders    None       Note:  This document was prepared using Dragon voice recognition software and may include unintentional dictation errors.   Merwyn Katos, MD 05/08/20 [phone]

## 2020-05-13 ENCOUNTER — Ambulatory Visit: Payer: Managed Care, Other (non HMO) | Admitting: Adult Health

## 2020-05-13 ENCOUNTER — Other Ambulatory Visit: Payer: Self-pay

## 2020-05-13 ENCOUNTER — Encounter: Payer: Self-pay | Admitting: *Deleted

## 2020-05-13 ENCOUNTER — Ambulatory Visit (INDEPENDENT_AMBULATORY_CARE_PROVIDER_SITE_OTHER): Payer: Managed Care, Other (non HMO) | Admitting: Family Medicine

## 2020-05-13 ENCOUNTER — Encounter: Payer: Self-pay | Admitting: Family Medicine

## 2020-05-13 VITALS — BP 143/83 | HR 67 | Resp 16 | Ht 74.0 in | Wt 263.0 lb

## 2020-05-13 DIAGNOSIS — F419 Anxiety disorder, unspecified: Secondary | ICD-10-CM | POA: Diagnosis not present

## 2020-05-13 DIAGNOSIS — E538 Deficiency of other specified B group vitamins: Secondary | ICD-10-CM

## 2020-05-13 DIAGNOSIS — I10 Essential (primary) hypertension: Secondary | ICD-10-CM

## 2020-05-13 DIAGNOSIS — R0789 Other chest pain: Secondary | ICD-10-CM | POA: Diagnosis not present

## 2020-05-13 MED ORDER — METOPROLOL SUCCINATE ER 25 MG PO TB24: 25.0000 mg | ORAL_TABLET | Freq: Every day | ORAL | 1 refills | Status: DC

## 2020-05-13 MED ORDER — CITALOPRAM HYDROBROMIDE 10 MG PO TABS: 10.0000 mg | ORAL_TABLET | Freq: Every day | ORAL | 1 refills | Status: DC

## 2020-05-13 NOTE — Progress Notes (Signed)
Established patient visit   Patient: Lawrence Andrews   DOB: 02/19/1986   35 y.o. Male  MRN: [id number] Visit Date: 05/13/2020  Today's healthcare provider: Megan Mans, MD   Chief Complaint  Patient presents with  . ER follow up    Subjective    HPI  Patient comes in today for follow-up.  His wife accompanies him.  He has had chronic ongoing issues with anxiety, hence the BuSpar and sertraline which he has failed.  Intermittent chest tightness that wakes him up at night.  He complains of fatigue and occasional numbness and tingling and dizziness that is random.  He also has acid reflux.  In order to cope with this he has been drinking about 12 beers per day.  He is ready to deal with these issues.  He has had no weight loss no fevers. Follow up ER visit  Patient was seen in ER for hypertension and palpitations on 05/07/2020. Treatment for this included EKG and labs. Both were WNL.  He reports good compliance with treatment. He reports this condition is Unchanged.        Medications: Outpatient Medications Prior to Visit  Medication Sig  . busPIRone (BUSPAR) 10 MG tablet Take 10 mg by mouth 2 (two) times daily.  . clonazePAM (KLONOPIN) 1 MG tablet Take 1 mg by mouth daily.  . pantoprazole (PROTONIX) 40 MG tablet Take 1 tablet (40 mg total) by mouth daily.  . Vitamin D, Ergocalciferol, (DRISDOL) 1.25 MG (50000 UNIT) CAPS capsule Take 1 capsule (50,000 Units total) by mouth every 7 (seven) days.  Marland Kitchen albuterol (VENTOLIN HFA) 108 (90 Base) MCG/ACT inhaler Inhale 2 puffs into the lungs every 6 (six) hours as needed for wheezing or shortness of breath.  Marland Kitchen amoxicillin-clavulanate (AUGMENTIN) 875-125 MG tablet Take 1 tablet by mouth 2 (two) times daily. (Patient not taking: Reported on 05/13/2020)  . benzonatate (TESSALON) 100 MG capsule Take 1 capsule (100 mg total) by mouth 2 (two) times daily as needed for cough. (Patient not taking: Reported on 05/13/2020)  . cyclobenzaprine  (FLEXERIL) 10 MG tablet Take 1 tablet (10 mg total) by mouth at bedtime as needed for muscle spasms. (Patient not taking: Reported on 05/13/2020)  . predniSONE (STERAPRED UNI-PAK 21 TAB) 10 MG (21) TBPK tablet PO: Take 6 tablets on day 1:Take 5 tablets day 2:Take 4 tablets day 3: Take 3 tablets day 4:Take 2 tablets day five: 5 Take 1 tablet day 6 (Patient not taking: Reported on 05/13/2020)  . [DISCONTINUED] sertraline (ZOLOFT) 50 MG tablet Take by mouth. (Patient not taking: Reported on 05/13/2020)   No facility-administered medications prior to visit.    Review of Systems  Constitutional: Negative.   Respiratory: Negative for cough.   Cardiovascular: Negative for chest pain and leg swelling.  Musculoskeletal: Negative for arthralgias, joint swelling, myalgias and neck stiffness.  Neurological: Negative for light-headedness and headaches.  Psychiatric/Behavioral: Negative for agitation, dysphoric mood, self-injury, sleep disturbance and suicidal ideas. The patient is not nervous/anxious and is not hyperactive.         Objective    BP (!) 143/83   Pulse 67   Resp 16   Ht 6\' 2"  (1.88 m)   Wt 263 lb (119.3 kg)   BMI 33.77 kg/m  BP Readings from Last 3 Encounters:  05/13/20 (!) 143/83  05/08/20 125/81  01/28/20 127/75   Wt Readings from Last 3 Encounters:  05/13/20 263 lb (119.3 kg)  05/07/20 250 lb (113.4 kg)  01/28/20 256 lb (116.1 kg)       Physical Exam Vitals reviewed.  Constitutional:      General: He is not in acute distress.    Appearance: Normal appearance. He is not ill-appearing, toxic-appearing or diaphoretic.  HENT:     Head: Normocephalic and atraumatic.     Right Ear: External ear normal.     Left Ear: External ear normal.     Mouth/Throat:     Mouth: Mucous membranes are moist.  Eyes:     General: No scleral icterus.       Right eye: No discharge.        Left eye: No discharge.     Extraocular Movements: Extraocular movements intact.  Neck:     Vascular:  No carotid bruit.  Cardiovascular:     Rate and Rhythm: Normal rate and regular rhythm.     Pulses: Normal pulses.     Heart sounds: Normal heart sounds. No murmur heard. No friction rub. No gallop.   Pulmonary:     Effort: Pulmonary effort is normal. No respiratory distress.     Breath sounds: Normal breath sounds. No stridor. No wheezing, rhonchi or rales.  Chest:     Chest wall: No tenderness.  Abdominal:     General: There is no distension.     Palpations: Abdomen is soft.     Tenderness: There is no abdominal tenderness.  Musculoskeletal:        General: Normal range of motion.     Cervical back: Normal range of motion and neck supple. No rigidity or tenderness.  Lymphadenopathy:     Cervical: No cervical adenopathy.  Skin:    General: Skin is warm.     Capillary Refill: Capillary refill takes less than 2 seconds.     Findings: No erythema.  Neurological:     Mental Status: He is oriented to person, place, and time.  Psychiatric:        Mood and Affect: Mood normal.        Behavior: Behavior normal.        Thought Content: Thought content normal.        Judgment: Judgment normal.       No results found for any visits on 05/13/20.  Assessment & Plan     1. Essential hypertension  - Vitamin B12 - CBC w/Diff/Platelet - Comprehensive Metabolic Panel (CMET) - TSH - Folate - Methylmalonic Acid - metoprolol succinate (TOPROL-XL) 25 MG 24 hr tablet; Take 1 tablet (25 mg total) by mouth daily.  Dispense: 30 tablet; Refill: 1  2. Chest wall discomfort  - EKG 12-Lead - Vitamin B12 - CBC w/Diff/Platelet - Comprehensive Metabolic Panel (CMET) - TSH - Folate - Methylmalonic Acid  3. Anxiety Major issue for this patient.  We will try citalopram 10 mg daily, continue the BuSpar.  Strongly advised him to cut down on alcohol to 2 to 3/day and then will cut back further.  Attempt to use benzodiazepines at that time. - Vitamin B12 - CBC w/Diff/Platelet I think he is  drinking too much to just completely stop drinking at this time.  I think he is at risk of withdrawal if he did so.  Follow-up in a month. - Comprehensive Metabolic Panel (CMET) - TSH - Folate - Methylmalonic Acid - citalopram (CELEXA) 10 MG tablet; Take 1 tablet (10 mg total) by mouth daily.  Dispense: 30 tablet; Refill: 1  4. Vitamin B12 deficiency Probably due to alcohol.  Treat  with B12 folate and thiamine - Vitamin B12   Return in about 1 month (around 06/13/2020).      I, Megan Mans, MD, have reviewed all documentation for this visit. The documentation on 05/15/20 for the exam, diagnosis, procedures, and orders are all accurate and complete.    Nikolaos Maddocks Wendelyn Breslow, MD  [facility] [phone] (phone) [phone] (fax)  [facility]

## 2020-05-13 NOTE — Patient Instructions (Signed)
Stop Sertraline and Start Citalopram 10 mg daily. Decrease Alcohol to 2-3 daily. Follow up in one month.

## 2020-05-20 LAB — CBC WITH DIFFERENTIAL/PLATELET
Basophils Absolute: 0.1 10*3/uL (ref 0.0–0.2)
Basos: 1 %
EOS (ABSOLUTE): 0.3 10*3/uL (ref 0.0–0.4)
Eos: 4 %
Hematocrit: 48.6 % (ref 37.5–51.0)
Hemoglobin: 16.1 g/dL (ref 13.0–17.7)
Immature Grans (Abs): 0 10*3/uL (ref 0.0–0.1)
Immature Granulocytes: 0 %
Lymphocytes Absolute: 2.1 10*3/uL (ref 0.7–3.1)
Lymphs: 29 %
MCH: 30.7 pg (ref 26.6–33.0)
MCHC: 33.1 g/dL (ref 31.5–35.7)
MCV: 93 fL (ref 79–97)
Monocytes Absolute: 0.7 10*3/uL (ref 0.1–0.9)
Monocytes: 10 %
Neutrophils Absolute: 4.2 10*3/uL (ref 1.4–7.0)
Neutrophils: 56 %
Platelets: 214 10*3/uL (ref 150–450)
RBC: 5.25 x10E6/uL (ref 4.14–5.80)
RDW: 11.9 % (ref 11.6–15.4)
WBC: 7.3 10*3/uL (ref 3.4–10.8)

## 2020-05-20 LAB — COMPREHENSIVE METABOLIC PANEL
ALT: 25 IU/L (ref 0–44)
AST: 25 IU/L (ref 0–40)
Albumin/Globulin Ratio: 2 (ref 1.2–2.2)
Albumin: 4.6 g/dL (ref 4.0–5.0)
Alkaline Phosphatase: 71 IU/L (ref 44–121)
BUN/Creatinine Ratio: 17 (ref 9–20)
BUN: 20 mg/dL (ref 6–20)
Bilirubin Total: 0.6 mg/dL (ref 0.0–1.2)
CO2: 20 mmol/L (ref 20–29)
Calcium: 8.9 mg/dL (ref 8.7–10.2)
Chloride: 100 mmol/L (ref 96–106)
Creatinine, Ser: 1.17 mg/dL (ref 0.76–1.27)
Globulin, Total: 2.3 g/dL (ref 1.5–4.5)
Glucose: 105 mg/dL — ABNORMAL HIGH (ref 65–99)
Potassium: 4.3 mmol/L (ref 3.5–5.2)
Sodium: 139 mmol/L (ref 134–144)
Total Protein: 6.9 g/dL (ref 6.0–8.5)
eGFR: 83 mL/min/{1.73_m2} (ref >59)

## 2020-05-20 LAB — TSH: TSH: 1.81 u[IU]/mL (ref 0.450–4.500)

## 2020-05-20 LAB — FOLATE: Folate: 5.9 ng/mL (ref >3.0)

## 2020-05-20 LAB — VITAMIN B12: Vitamin B-12: 483 pg/mL (ref 232–1245)

## 2020-05-20 LAB — METHYLMALONIC ACID, SERUM: Methylmalonic Acid: 191 nmol/L (ref 0–378)

## 2020-06-03 ENCOUNTER — Ambulatory Visit: Payer: Self-pay

## 2020-06-03 ENCOUNTER — Telehealth: Payer: Managed Care, Other (non HMO) | Admitting: Family

## 2020-06-03 DIAGNOSIS — S90466A Insect bite (nonvenomous), unspecified lesser toe(s), initial encounter: Secondary | ICD-10-CM | POA: Diagnosis not present

## 2020-06-03 DIAGNOSIS — W57XXXA Bitten or stung by nonvenomous insect and other nonvenomous arthropods, initial encounter: Secondary | ICD-10-CM

## 2020-06-03 MED ORDER — DOXYCYCLINE HYCLATE 100 MG PO TABS: 100.0000 mg | ORAL_TABLET | Freq: Two times a day (BID) | ORAL | 0 refills | Status: DC

## 2020-06-03 NOTE — Progress Notes (Signed)
Thank you for describing your tick bite, Here is how we plan to help! Based on the information that you shared with me it looks like you have A tick that bite that we will treat with a short course of doxycycline.  In most cases a tick bite is painless and does not itch.  Most tick bites in which the tick is quickly removed do not require prescriptions. Ticks can transmit several diseases if they are infected and remain attacked to your skin. Therefore the length that the tick was attached and any symptoms you have experienced after the bite are import to accurately develop your custom treatment plan. In most cases a single dose of doxycycline may prevent the development of a more serious condition.  Based on your information I have Provided a home care guide for tick bites and  instructions on when to call for help. and Your symptoms indicate that you need a longer course of antibiotics and a follow up visit with a provider. I have sent doxycycline 100 mg twice a day for 21 days to the pharmacy that you selected. You will need to schedule a follow up visit with your provider. If you do not have a primary care provider you may use our telehealth physicians on the web at [facility]/  Which ticks  are associated with illness?  The Wood Tick (dog tick) is the size of a watermelon seed and can sometimes transmit [facility] spotted fever and [place] tick fever.   The Deer Tick (black-legged tick) is between the size of a poppy seed (pin head) and an apple seed, and can sometimes transmit Lyme disease.  A brown to black tick with a white splotch on its back is likely a male Amblyomma americanum ([place] tick). This tick has been associated with Southern Tick Associated illness ( STARI)  Lyme disease has become the most common tick-borne illness in the [place]. The risk of Lyme disease following a recognized deer tick bite is estimated to be 1%.  The majority of cases of Lyme disease  start with a bull's eye rash at the site of the tick bite. The rash can occur days to weeks (typically 7-10 days) after a tick bite. Treatment with antibiotics is indicated if this rash appears. Flu-like symptoms may accompany the rash, including: fever, chills, headaches, muscle aches, and fatigue. Removing ticks promptly may prevent tick borne disease.  What can be used to prevent Tick Bites?   Insect repellant with at leas 20% DEET.  Wearing long pants with sock and shoes.  Avoiding tall grass and heavily wooded areas.  Checking your skin after being outdoors.  Shower with a washcloth after outdoor exposures.  HOME CARE ADVICE FOR TICK BITE  1. Wood Tick Removal:  o Use a pair of tweezers and grasp the wood tick close to the skin (on its head). Pull the wood tick straight upward without twisting or crushing it. Maintain a steady pressure until it releases its grip.   o If tweezers aren't available, use fingers, a loop of thread around the jaws, or a needle between the jaws for traction.  o Note: covering the tick with petroleum jelly, nail polish or rubbing alcohol doesn't work. Neither does touching the tick with a hot or cold object. 2. Tiny Deer Tick Removal:   o Needs to be scraped off with a knife blade or credit card edge. o Place tick in a sealed container (e.g. glass jar, zip lock plastic bag), in  case your doctor wants to see it. 3. Tick's Head Removal:  o If the wood tick's head breaks off in the skin, it must be removed. Clean the skin. Then use a sterile needle to uncover the head and lift it out or scrape it off.  o If a very small piece of the head remains, the skin will eventually slough it off. 4. Antibiotic Ointment:  o Wash the wound and your hands with soap and water after removal to prevent catching any tick disease.  Apply an over the counter antibiotic ointment (e.g. bacitracin) to the bite once. 5. Expected Course: Tick bites normally don't itch or hurt. That's  why they often go unnoticed. 6. Call Your Doctor If:  o You can't remove the tick or the tick's head o Fever, a severe head ache, or rash occur in the next 2 weeks o Bite begins to look infected o Lyme's disease is common in your area o You have not had a tetanus in the last 10 years o Your current symptoms become worse    MAKE SURE YOU   Understand these instructions.  Will watch your condition.  Will get help right away if you are not doing well or get worse.   Thank you for choosing an e-visit.  Your e-visit answers were reviewed by a board certified advanced clinical practitioner to complete your personal care plan. Depending upon the condition, your plan could have included both over the counter or prescription medications. Please review your pharmacy choice. If there is a problem you may use MyChart messaging to have the prescription routed to another pharmacy. Your safety is important to [place]. If you have drug allergies check your prescription carefully.   You can use MyChart to ask questions about today's visit, request a non-urgent call back, or ask for a work or school excuse for 24 hours related to this e-Visit. If it has been greater than 24 hours you will need to follow up with your provider, or enter a new e-Visit to address those concerns.  You will get an email in the next two days asking about your experience. I hope  that your e-visit has been valuable and will speed your recovery  Approximately 5 minutes was spent documenting and reviewing patient's chart.

## 2020-06-03 NOTE — Telephone Encounter (Signed)
Pt. Reports he had a tick bite this past Sunday. Right foot at his second toe. Area is red and swollen. Has a blister at bite site. No fever. No availability in the practice this week per [place]. Pt. Will do telehealth visit through My Chart.  Reason for Disposition . [1] 2 to 14 days following tick bite AND [2] widespread rash or headache AND [3] no fever  Answer Assessment - Initial Assessment Questions 1. TYPE of TICK: "Is it a wood tick or a deer tick?" If unsure, ask: "What size was the tick?" "Did it look more like a watermelon seed or a poppy seed?"      Tiny with white spot 2. LOCATION: "Where is the tick bite located?"      Right foot 3. ONSET: "How long do you think the tick was attached before you removed it?" (Hours or days)      Sunday 4. TETANUS: "When was the last tetanus booster?"      Unsure 5. PREGNANCY: "Is there any chance you are pregnant?" "When was your last menstrual period?"     n/a  Protocols used: TICK BITE-A-AH

## 2020-06-14 ENCOUNTER — Other Ambulatory Visit: Payer: Self-pay | Admitting: Family

## 2020-06-14 DIAGNOSIS — W57XXXA Bitten or stung by nonvenomous insect and other nonvenomous arthropods, initial encounter: Secondary | ICD-10-CM

## 2020-06-23 ENCOUNTER — Telehealth: Payer: Self-pay | Admitting: Adult Health

## 2020-06-23 NOTE — Telephone Encounter (Signed)
[facility] faxed refill request for the following medications:  Vitamin D, Ergocalciferol, (DRISDOL) 1.25 MG (50000 UNIT) CAPS capsule  Last Rx: 01/28/20 LOV: 05/13/20 Please advise. Thanks TNP

## 2020-06-25 ENCOUNTER — Telehealth: Payer: Self-pay

## 2020-06-25 NOTE — Telephone Encounter (Signed)
I was going to deny request, last time patient was see to hae his vitamin d checked was 10/103/21, patient has not had his vitamin D checked since. KW

## 2020-06-25 NOTE — Telephone Encounter (Signed)
[facility] refill request for the following medications:  Vitamin D, Ergocalciferol, (DRISDOL) 1.25 MG (50000 UNIT) CAPS capsule  Please advise. Thanks TNP

## 2020-06-26 NOTE — Telephone Encounter (Signed)
Agree and he sees Dr. Sullivan Lone in July.

## 2020-07-06 ENCOUNTER — Other Ambulatory Visit: Payer: Self-pay | Admitting: Family Medicine

## 2020-07-06 DIAGNOSIS — F419 Anxiety disorder, unspecified: Secondary | ICD-10-CM

## 2020-07-06 DIAGNOSIS — I10 Essential (primary) hypertension: Secondary | ICD-10-CM

## 2020-07-06 NOTE — Telephone Encounter (Signed)
Requested Prescriptions  Pending Prescriptions Disp Refills  . metoprolol succinate (TOPROL-XL) 25 MG 24 hr tablet [Pharmacy Med Name: METOPROLOL SUCC ER 25 MG TAB] 30 tablet 1    Sig: TAKE 1 TABLET (25 MG TOTAL) BY MOUTH DAILY.     Cardiovascular:  Beta Blockers Failed - 07/06/2020  1:24 AM      Failed - Last BP in normal range    BP Readings from Last 1 Encounters:  05/13/20 (!) 143/83         Passed - Last Heart Rate in normal range    Pulse Readings from Last 1 Encounters:  05/13/20 67         Passed - Valid encounter within last 6 months    Recent Outpatient Visits          1 month ago Essential hypertension   [facility] Maple Hudson., MD   3 months ago COVID-19   [facility] Flinchum, Eula Fried, FNP   4 months ago Acute non-recurrent pansinusitis   [facility] Flinchum, Eula Fried, FNP   5 months ago Chronic midline back pain, unspecified back location   [facility] Flinchum, Eula Fried, FNP   6 months ago Anxiety   [facility] Flinchum, Eula Fried, FNP      Future Appointments            In 2 weeks Maple Hudson., MD [facility]           . citalopram (CELEXA) 10 MG tablet [Pharmacy Med Name: CITALOPRAM HBR 10 MG TABLET] 30 tablet 1    Sig: TAKE 1 TABLET BY MOUTH EVERY DAY     Psychiatry:  Antidepressants - SSRI Passed - 07/06/2020  1:24 AM      Passed - Completed PHQ-2 or PHQ-9 in the last 360 days      Passed - Valid encounter within last 6 months    Recent Outpatient Visits          1 month ago Essential hypertension   [facility] Maple Hudson., MD   3 months ago COVID-19   [facility] Flinchum, Eula Fried, FNP   4 months ago Acute non-recurrent pansinusitis   [facility] Flinchum, Eula Fried, FNP   5 months ago Chronic midline back pain, unspecified back location   [facility] Flinchum, Eula Fried, FNP   6 months ago Anxiety   [facility] Flinchum, Eula Fried, FNP      Future Appointments            In 2 weeks Maple Hudson., MD [facility]

## 2020-07-08 ENCOUNTER — Other Ambulatory Visit: Payer: Self-pay | Admitting: Family

## 2020-07-08 DIAGNOSIS — W57XXXA Bitten or stung by nonvenomous insect and other nonvenomous arthropods, initial encounter: Secondary | ICD-10-CM

## 2020-07-08 DIAGNOSIS — S90466A Insect bite (nonvenomous), unspecified lesser toe(s), initial encounter: Secondary | ICD-10-CM

## 2020-07-22 ENCOUNTER — Encounter: Payer: Self-pay | Admitting: Family Medicine

## 2020-07-22 ENCOUNTER — Ambulatory Visit (INDEPENDENT_AMBULATORY_CARE_PROVIDER_SITE_OTHER): Payer: Managed Care, Other (non HMO) | Admitting: Family Medicine

## 2020-07-22 ENCOUNTER — Other Ambulatory Visit: Payer: Self-pay

## 2020-07-22 VITALS — BP 128/81 | HR 68 | Temp 97.8°F | Resp 16 | Wt 260.0 lb

## 2020-07-22 DIAGNOSIS — I10 Essential (primary) hypertension: Secondary | ICD-10-CM

## 2020-07-22 DIAGNOSIS — F419 Anxiety disorder, unspecified: Secondary | ICD-10-CM

## 2020-07-22 DIAGNOSIS — G8929 Other chronic pain: Secondary | ICD-10-CM | POA: Diagnosis not present

## 2020-07-22 DIAGNOSIS — M5440 Lumbago with sciatica, unspecified side: Secondary | ICD-10-CM | POA: Diagnosis not present

## 2020-07-22 NOTE — Patient Instructions (Signed)
Try over-the-counter Alpha Lipoic Acid daily.

## 2020-07-22 NOTE — Progress Notes (Signed)
I,April Miller,acting as a scribe for Megan Mans, MD.,have documented all relevant documentation on the behalf of Megan Mans, MD,as directed by  Megan Mans, MD while in the presence of Megan Mans, MD.   Established patient visit   Patient: Lawrence Andrews   DOB: [birth year]   35 y.o. Male  MRN: [id number] Visit Date: 07/22/2020  Today's healthcare provider: Megan Mans, MD   Chief Complaint  Patient presents with  . Follow-up  . Hypertension  . Anxiety   Subjective    HPI   Patient states that he feels 75% better emotionally than his last visit.  He has cut way back on his alcohol and had a single glass of wine last night.  He has chronic low back pain and has tingling in his feet and states his legs fall asleep all the time.  He states this is transient however.  Hypertension, follow-up  BP Readings from Last 3 Encounters:  07/22/20 128/81  05/13/20 (!) 143/83  05/08/20 125/81   Wt Readings from Last 3 Encounters:  07/22/20 260 lb (117.9 kg)  05/13/20 263 lb (119.3 kg)  05/07/20 250 lb (113.4 kg)     He was last seen for hypertension 2 months ago.  BP at that visit was 143/83. Management since that visit includes; on metoprolol He reports good compliance with treatment. He is not having side effects. none He is exercising. He is not adherent to low salt diet.   Outside blood pressures are not checking.  He does not smoke.  Use of agents associated with hypertension: none.   --------------------------------------------------------------------  Anxiety, Follow-up  He was last seen for anxiety 2 months ago. Changes made at last visit include; We will try citalopram 10 mg daily, continue the BuSpar.  Strongly advised him to cut down on alcohol to 2 to 3/day and then will cut back further.  Attempt to use benzodiazepines at that time.   He reports good compliance with treatment. He reports good tolerance of treatment. He  is having side effects. headache  He feels his anxiety is mild and Unchanged since last visit.  Symptoms: No chest pain No difficulty concentrating  No dizziness No fatigue  No feelings of losing control No insomnia  No irritable No palpitations  No panic attacks No racing thoughts  No shortness of breath No sweating  No tremors/shakes    GAD-7 Results GAD-7 Generalized Anxiety Disorder Screening Tool 07/22/2020 12/24/2019 11/26/2019  1. Feeling Nervous, Anxious, or on Edge 1 3 2   2. Not Being Able to Stop or Control Worrying 1 3 3   3. Worrying Too Much About Different Things 1 3 2   4. Trouble Relaxing 2 3 3   5. Being So Restless it's Hard To Sit Still 0 2 2  6. Becoming Easily Annoyed or Irritable 0 3 2  7. Feeling Afraid As If Something Awful Might Happen 1 3 2   Total GAD-7 Score 6 20 16   Difficulty At Work, Home, or Getting  Along With Others? Somewhat difficult Very difficult Not difficult at all    PHQ-9 Scores PHQ9 SCORE ONLY 07/22/2020 12/24/2019 11/26/2019  PHQ-9 Total Score 5 10 8     --------------------------------------------------------------------       Medications: Outpatient Medications Prior to Visit  Medication Sig  . citalopram (CELEXA) 10 MG tablet TAKE 1 TABLET BY MOUTH EVERY DAY  . clonazePAM (KLONOPIN) 1 MG tablet Take 1 mg by mouth daily.  . metoprolol succinate (TOPROL-XL) 25  MG 24 hr tablet TAKE 1 TABLET (25 MG TOTAL) BY MOUTH DAILY.  . pantoprazole (PROTONIX) 40 MG tablet Take 1 tablet (40 mg total) by mouth daily.  Marland Kitchen albuterol (VENTOLIN HFA) 108 (90 Base) MCG/ACT inhaler Inhale 2 puffs into the lungs every 6 (six) hours as needed for wheezing or shortness of breath. (Patient not taking: Reported on 07/22/2020)  . benzonatate (TESSALON) 100 MG capsule Take 1 capsule (100 mg total) by mouth 2 (two) times daily as needed for cough.  . busPIRone (BUSPAR) 10 MG tablet Take 10 mg by mouth 2 (two) times daily.  . cyclobenzaprine (FLEXERIL) 10 MG tablet  Take 1 tablet (10 mg total) by mouth at bedtime as needed for muscle spasms. (Patient not taking: No sig reported)  . doxycycline (VIBRA-TABS) 100 MG tablet Take 1 tablet (100 mg total) by mouth 2 (two) times daily. (Patient not taking: Reported on 07/22/2020)  . predniSONE (STERAPRED UNI-PAK 21 TAB) 10 MG (21) TBPK tablet PO: Take 6 tablets on day 1:Take 5 tablets day 2:Take 4 tablets day 3: Take 3 tablets day 4:Take 2 tablets day five: 5 Take 1 tablet day 6 (Patient not taking: No sig reported)  . Vitamin D, Ergocalciferol, (DRISDOL) 1.25 MG (50000 UNIT) CAPS capsule Take 1 capsule (50,000 Units total) by mouth every 7 (seven) days. (Patient not taking: Reported on 07/22/2020)   No facility-administered medications prior to visit.    Review of Systems  Constitutional: Negative for appetite change, chills and fever.  Respiratory: Negative for chest tightness, shortness of breath and wheezing.   Cardiovascular: Negative for chest pain and palpitations.  Gastrointestinal: Negative for abdominal pain, nausea and vomiting.        Objective    BP 128/81 (BP Location: Right Arm, Patient Position: Sitting, Cuff Size: Large)   Pulse 68   Temp 97.8 F (36.6 C) (Oral)   Resp 16   Wt 260 lb (117.9 kg)   SpO2 98%   BMI 33.38 kg/m  BP Readings from Last 3 Encounters:  07/22/20 128/81  05/13/20 (!) 143/83  05/08/20 125/81   Wt Readings from Last 3 Encounters:  07/22/20 260 lb (117.9 kg)  05/13/20 263 lb (119.3 kg)  05/07/20 250 lb (113.4 kg)       Physical Exam Vitals reviewed.  Constitutional:      General: He is not in acute distress.    Appearance: Normal appearance. He is not ill-appearing, toxic-appearing or diaphoretic.  HENT:     Head: Normocephalic and atraumatic.     Right Ear: External ear normal.     Left Ear: External ear normal.     Mouth/Throat:     Mouth: Mucous membranes are moist.  Eyes:     General: No scleral icterus.       Right eye: No discharge.        Left  eye: No discharge.     Extraocular Movements: Extraocular movements intact.  Neck:     Vascular: No carotid bruit.  Cardiovascular:     Rate and Rhythm: Normal rate and regular rhythm.     Pulses: Normal pulses.     Heart sounds: Normal heart sounds. No murmur heard. No friction rub. No gallop.   Pulmonary:     Effort: Pulmonary effort is normal. No respiratory distress.     Breath sounds: Normal breath sounds. No stridor. No wheezing, rhonchi or rales.  Chest:     Chest wall: No tenderness.  Abdominal:     General:  There is no distension.     Palpations: Abdomen is soft.     Tenderness: There is no abdominal tenderness.  Musculoskeletal:     Cervical back: Normal range of motion and neck supple. No rigidity or tenderness.  Lymphadenopathy:     Cervical: No cervical adenopathy.  Skin:    General: Skin is warm and dry.     Findings: No erythema.  Neurological:     General: No focal deficit present.     Mental Status: He is oriented to person, place, and time.     Comments: Rifenbark low return is normal neurosensory exam of his feet is normal.  Psychiatric:        Mood and Affect: Mood normal.        Behavior: Behavior normal.        Thought Content: Thought content normal.        Judgment: Judgment normal.      Depression screen [facility] 2/9 07/22/2020 12/24/2019 11/26/2019 07/16/2019  Decreased Interest 0 3 1 0  Down, Depressed, Hopeless 0 2 1 1   PHQ - 2 Score 0 5 2 1   Altered sleeping 2 1 2 1   Tired, decreased energy 3 3 3 2   Change in appetite 0 1 1 0  Feeling bad or failure about yourself  0 0 0 0  Trouble concentrating 0 0 0 0  Moving slowly or fidgety/restless 0 0 0 0  Suicidal thoughts 0 0 0 0  PHQ-9 Score 5 10 8 4   Difficult doing work/chores Somewhat difficult Very difficult Not difficult at all Not difficult at all   GAD 7 : Generalized Anxiety Score 07/22/2020 12/24/2019 11/26/2019 07/16/2019  Nervous, Anxious, on Edge 1 3 2 3   Control/stop worrying 1 3 3 2   Worry  too much - different things 1 3 2 3   Trouble relaxing 2 3 3 2   Restless 0 2 2 1   Easily annoyed or irritable 0 3 2 1   Afraid - awful might happen 1 3 2 1   Total GAD 7 Score 6 20 16 13   Anxiety Difficulty Somewhat difficult Very difficult Not difficult at all Somewhat difficult     No results found for any visits on 07/22/20.  Assessment & Plan      1. Essential hypertension Good control metoprolol  2. Anxiety Improved sniffily on Celexa and as needed Klonopin.  3. Chronic bilateral low back pain with sciatica, sciatica laterality unspecified Follow-up as needed.  Continue exercise and weight loss.   Return in about 4 months (around 11/22/2020).      I, 02/23/2020, MD, have reviewed all documentation for this visit. The documentation on 07/25/20 for the exam, diagnosis, procedures, and orders are all accurate and complete.    Jazier Mcglamery 09/15/2019, MD  [facility] [phone] (phone) [phone] (fax)  [facility]

## 2020-08-05 ENCOUNTER — Other Ambulatory Visit: Payer: Self-pay | Admitting: Adult Health

## 2020-08-11 ENCOUNTER — Telehealth: Payer: Self-pay

## 2020-08-11 ENCOUNTER — Other Ambulatory Visit: Payer: Self-pay | Admitting: Family

## 2020-08-11 DIAGNOSIS — W57XXXA Bitten or stung by nonvenomous insect and other nonvenomous arthropods, initial encounter: Secondary | ICD-10-CM

## 2020-08-11 NOTE — Telephone Encounter (Signed)
Patient not taking

## 2020-08-11 NOTE — Telephone Encounter (Signed)
[facility] refill request for the following medications: ° °Vitamin D, Ergocalciferol, (DRISDOL) 1.25 MG (50000 UNIT) CAPS capsule  ° °Please advise. ° °

## 2020-08-12 ENCOUNTER — Other Ambulatory Visit: Payer: Self-pay | Admitting: Family Medicine

## 2020-08-13 ENCOUNTER — Other Ambulatory Visit: Payer: Self-pay | Admitting: Family Medicine

## 2020-08-13 DIAGNOSIS — E559 Vitamin D deficiency, unspecified: Secondary | ICD-10-CM

## 2020-08-13 NOTE — Telephone Encounter (Signed)
[facility] refill request for the following medications:  Vitamin D, Ergocalciferol, (DRISDOL) 1.25 MG (50000 UNIT) CAPS capsule  Last Rx: 01/28/20 Qty: 12 Refills: 0 LOV: 07/22/20 NOV: 11/23/20 Vit D Labs: 12/25/19 Please advise. Thanks TNP

## 2020-08-17 NOTE — Telephone Encounter (Signed)
Will deny request, patient had vitamin D last checked on 12/25/19 and on 07/22/20 patient reported that he was not taking medication anymore. Labs would have to be repeated before medication could be approved. KW

## 2020-08-24 ENCOUNTER — Other Ambulatory Visit: Payer: Self-pay

## 2020-08-24 ENCOUNTER — Telehealth: Payer: Self-pay

## 2020-08-24 NOTE — Telephone Encounter (Signed)
Rx request was sent to provider.

## 2020-08-24 NOTE — Telephone Encounter (Signed)
Copied from CRM [id number]. Topic: General - Other >> Aug 24, 2020  1:22 PM Pawlus, Maxine Glenn A wrote: Reason for CRM: Pt was following up on his medication refill request on pantoprazole (PROTONIX) 40 MG tablet, pt stated he really needs this refilled as soon as possible. Please advise.

## 2020-08-24 NOTE — Telephone Encounter (Signed)
Please review. Ok to refill?  

## 2020-08-24 NOTE — Telephone Encounter (Signed)
[facility] ref[facility] request for the following medications:  pantoprazole (PROTONIX) 40 MG tablet   Please advise.  

## 2020-08-25 ENCOUNTER — Other Ambulatory Visit: Payer: Self-pay | Admitting: Adult Health

## 2020-08-25 ENCOUNTER — Encounter: Payer: Self-pay | Admitting: Adult Health

## 2020-08-25 MED ORDER — PANTOPRAZOLE SODIUM 40 MG PO TBEC: 40.0000 mg | DELAYED_RELEASE_TABLET | Freq: Every day | ORAL | 1 refills | Status: DC

## 2020-08-25 NOTE — Telephone Encounter (Signed)
Pt said Dr. Sullivan Lone is his provider now.  He no longer uses [facility].  [facility]  [facility].  CB#  [phone]

## 2020-08-25 NOTE — Telephone Encounter (Signed)
Medication was sent into the pharmacy.  

## 2020-09-07 ENCOUNTER — Other Ambulatory Visit: Payer: Self-pay | Admitting: Family Medicine

## 2020-09-07 DIAGNOSIS — I10 Essential (primary) hypertension: Secondary | ICD-10-CM

## 2020-09-07 DIAGNOSIS — F419 Anxiety disorder, unspecified: Secondary | ICD-10-CM

## 2020-09-25 ENCOUNTER — Ambulatory Visit: Payer: Self-pay | Admitting: *Deleted

## 2020-09-25 ENCOUNTER — Other Ambulatory Visit: Payer: Self-pay | Admitting: *Deleted

## 2020-09-25 ENCOUNTER — Telehealth: Payer: Self-pay | Admitting: Family Medicine

## 2020-09-25 MED ORDER — GABAPENTIN 100 MG PO CAPS: 100.0000 mg | ORAL_CAPSULE | Freq: Two times a day (BID) | ORAL | 0 refills | Status: DC

## 2020-09-25 NOTE — Telephone Encounter (Signed)
Pt c/o having dizzy spells for at least the last 6 months on a daily basis.   He has not passed out but feels like he could.  Denies shortness of breath, chest pain, being dehydrated, recently sick.    Was seen by a cardiologist 2 yrs ago and had a stress test and everything was fine.  He told me I have occasional palpitations but nothing out of the ordinary to be concerned about.  I made him an appt with Dr. Sullivan Lone for 10/12/2020 at 3:40.    I instructed him to go to the ED if he passed out, the dizzy spells became worse, had chest pain, shortness of breath, started breaking out in sweats, his speech became slurred, had confusion, weakness on one side of his body.   Dr. Sullivan Lone is treating him now for numbness in his legs.  He has been prescribed medication for it.  He verbalized understanding of going to the ED if any of these symptoms occurred.   I made him an appt with Dr. Sullivan Lone for 10/12/2020 at 3:40 in office visit.

## 2020-09-25 NOTE — Telephone Encounter (Signed)
Patient was advised and is agreeable to trying gabapentin. Rx was sent to pharmacy.

## 2020-09-25 NOTE — Telephone Encounter (Signed)
Pt is calling to let dr Sullivan Lone the alpha lipoic acid OTC  is not helping  leg numbness. [facility] [address] phone number [phone]

## 2020-09-25 NOTE — Telephone Encounter (Signed)
Please advise 

## 2020-09-25 NOTE — Telephone Encounter (Signed)
FYI

## 2020-09-25 NOTE — Telephone Encounter (Signed)
Reason for Disposition  [1] MILD dizziness (e.g., walking normally) AND [2] has NOT been evaluated by physician for this  (Exception: dizziness caused by heat exposure, sudden standing, or poor fluid intake)  Answer Assessment - Initial Assessment Questions 1. DESCRIPTION: "Describe your dizziness."     Pt mentioned being light headed.   My BP is stable. My vision peripherally feels like it's closing in.   I'm having dizzy spells daily.   It's not new.   It's been going on for a while.  It's been going on for a while.    I didn't mention it last time I was in to see Dr Sullivan Lone. 2. LIGHTHEADED: "Do you feel lightheaded?" (e.g., somewhat faint, woozy, weak upon standing)     No passed out 3. VERTIGO: "Do you feel like either you or the room is spinning or tilting?" (i.e. vertigo)     No 4. SEVERITY: "How bad is it?"  "Do you feel like you are going to faint?" "Can you stand and walk?"   - MILD: Feels slightly dizzy, but walking normally.   - MODERATE: Feels unsteady when walking, but not falling; interferes with normal activities (e.g., school, work).   - SEVERE: Unable to walk without falling, or requires assistance to walk without falling; feels like passing out now.     I get this weird feeling like I'm going to pass out.   No chest pain or shortness of breath 5. ONSET:  "When did the dizziness begin?"     It's daily.   It's been going on for 6 months or longer.   No history of heart problems.   I saw a cardiologist 2 yrs ago said I was fine.    6. AGGRAVATING FACTORS: "Does anything make it worse?" (e.g., standing, change in head position)     I can just be walking along and feel like I'm going to pass out. 7. HEART RATE: "Can you tell me your heart rate?" "How many beats in 15 seconds?"  (Note: not all patients can do this)       Not asked 8. CAUSE: "What do you think is causing the dizziness?"     I don't know 9. RECURRENT SYMPTOM: "Have you had dizziness before?" If Yes, ask: "When was  the last time?" "What happened that time?"     Yes for months 10. OTHER SYMPTOMS: "Do you have any other symptoms?" (e.g., fever, chest pain, vomiting, diarrhea, bleeding)       No 11. PREGNANCY: "Is there any chance you are pregnant?" "When was your last menstrual period?"       N/A  Protocols used: Dizziness - Lightheadedness-A-AH

## 2020-09-29 NOTE — Telephone Encounter (Signed)
Done

## 2020-10-03 ENCOUNTER — Other Ambulatory Visit: Payer: Self-pay | Admitting: Family Medicine

## 2020-10-03 DIAGNOSIS — F419 Anxiety disorder, unspecified: Secondary | ICD-10-CM

## 2020-10-03 DIAGNOSIS — I10 Essential (primary) hypertension: Secondary | ICD-10-CM

## 2020-10-03 NOTE — Telephone Encounter (Signed)
Last RF 09/07/20 #30 1 RF

## 2020-10-04 NOTE — Telephone Encounter (Signed)
Requested medication (s) are due for refill today: no  Requested medication (s) are on the active medication list: yes  Last refill:  09/07/20  #30 1 RF  Future visit scheduled: yes  Notes to clinic:  pt requesting 90 day refills   Requested Prescriptions  Pending Prescriptions Disp Refills   citalopram (CELEXA) 10 MG tablet [Pharmacy Med Name: CITALOPRAM HBR 10 MG TABLET] 90 tablet 1    Sig: TAKE 1 TABLET BY MOUTH EVERY DAY      Psychiatry:  Antidepressants - SSRI Passed - 10/03/2020  2:33 PM      Passed - Completed PHQ-2 or PHQ-9 in the last 360 days      Passed - Valid encounter within last 6 months    Recent Outpatient Visits           2 months ago Essential hypertension   [facility] Maple Hudson., MD   4 months ago Essential hypertension   [facility] Maple Hudson., MD   6 months ago COVID-19   [facility] Flinchum, Eula Fried, FNP   7 months ago Acute non-recurrent pansinusitis   [facility] Flinchum, Eula Fried, FNP   8 months ago Chronic midline back pain, unspecified back location   [facility] Flinchum, Eula Fried, FNP       Future Appointments             In 2 weeks Maple Hudson., MD [facility]   In 1 month Maple Hudson., MD [facility]

## 2020-10-12 ENCOUNTER — Ambulatory Visit: Payer: Self-pay | Admitting: Family Medicine

## 2020-10-20 ENCOUNTER — Other Ambulatory Visit: Payer: Self-pay

## 2020-10-20 ENCOUNTER — Encounter: Payer: Self-pay | Admitting: Family Medicine

## 2020-10-20 ENCOUNTER — Ambulatory Visit (INDEPENDENT_AMBULATORY_CARE_PROVIDER_SITE_OTHER): Payer: Managed Care, Other (non HMO) | Admitting: Family Medicine

## 2020-10-20 VITALS — BP 115/73 | HR 71 | Resp 16 | Wt 259.0 lb

## 2020-10-20 DIAGNOSIS — F410 Panic disorder [episodic paroxysmal anxiety] without agoraphobia: Secondary | ICD-10-CM | POA: Diagnosis not present

## 2020-10-20 DIAGNOSIS — E291 Testicular hypofunction: Secondary | ICD-10-CM

## 2020-10-20 DIAGNOSIS — F419 Anxiety disorder, unspecified: Secondary | ICD-10-CM

## 2020-10-20 DIAGNOSIS — R202 Paresthesia of skin: Secondary | ICD-10-CM

## 2020-10-20 DIAGNOSIS — R42 Dizziness and giddiness: Secondary | ICD-10-CM

## 2020-10-20 DIAGNOSIS — G629 Polyneuropathy, unspecified: Secondary | ICD-10-CM

## 2020-10-20 DIAGNOSIS — I10 Essential (primary) hypertension: Secondary | ICD-10-CM

## 2020-10-20 DIAGNOSIS — R200 Anesthesia of skin: Secondary | ICD-10-CM

## 2020-10-20 NOTE — Progress Notes (Signed)
Established patient visit   Patient: Lawrence Andrews   DOB: [birth year]   35 y.o. Male  MRN: [id number] Visit Date: 10/20/2020  Today's healthcare provider: Megan Mans, MD   Chief Complaint  Patient presents with   Dizziness    Subjective    Dizziness The current episode started more than 1 month ago. Associated symptoms include fatigue, myalgias and numbness. Pertinent negatives include no visual change. The symptoms are aggravated by walking, bending and exertion. He has tried rest for the symptoms. The treatment provided mild relief.   Patient comes in today for follow-up.  He is not drinking much at all.  He feels feeling much better.  He comes in today with numbness and tingling of both legs.  Low energy and low testosterone and fatigue.  Chronic anxiety.      Medications: Outpatient Medications Prior to Visit  Medication Sig   albuterol (VENTOLIN HFA) 108 (90 Base) MCG/ACT inhaler Inhale 2 puffs into the lungs every 6 (six) hours as needed for wheezing or shortness of breath. (Patient not taking: Reported on 07/22/2020)   benzonatate (TESSALON) 100 MG capsule Take 1 capsule (100 mg total) by mouth 2 (two) times daily as needed for cough.   busPIRone (BUSPAR) 10 MG tablet Take 10 mg by mouth 2 (two) times daily.   citalopram (CELEXA) 10 MG tablet TAKE 1 TABLET BY MOUTH EVERY DAY   clonazePAM (KLONOPIN) 1 MG tablet Take 1 mg by mouth daily.   cyclobenzaprine (FLEXERIL) 10 MG tablet Take 1 tablet (10 mg total) by mouth at bedtime as needed for muscle spasms. (Patient not taking: No sig reported)   doxycycline (VIBRA-TABS) 100 MG tablet Take 1 tablet (100 mg total) by mouth 2 (two) times daily. (Patient not taking: Reported on 07/22/2020)   gabapentin (NEURONTIN) 100 MG capsule Take 1 capsule (100 mg total) by mouth 2 (two) times daily.   metoprolol succinate (TOPROL-XL) 25 MG 24 hr tablet TAKE 1 TABLET (25 MG TOTAL) BY MOUTH DAILY.   pantoprazole (PROTONIX) 40 MG  tablet Take 1 tablet (40 mg total) by mouth daily.   predniSONE (STERAPRED UNI-PAK 21 TAB) 10 MG (21) TBPK tablet PO: Take 6 tablets on day 1:Take 5 tablets day 2:Take 4 tablets day 3: Take 3 tablets day 4:Take 2 tablets day five: 5 Take 1 tablet day 6 (Patient not taking: No sig reported)   Vitamin D, Ergocalciferol, (DRISDOL) 1.25 MG (50000 UNIT) CAPS capsule Take 1 capsule (50,000 Units total) by mouth every 7 (seven) days. (Patient not taking: Reported on 07/22/2020)   No facility-administered medications prior to visit.    Review of Systems  Constitutional:  Positive for fatigue.  Musculoskeletal:  Positive for myalgias.  Neurological:  Positive for dizziness and numbness.       Objective    BP 115/73   Pulse 71   Resp 16   Wt 259 lb (117.5 kg)   BMI 33.25 kg/m  BP Readings from Last 3 Encounters:  10/20/20 115/73  07/22/20 128/81  05/13/20 (!) 143/83   Wt Readings from Last 3 Encounters:  10/20/20 259 lb (117.5 kg)  07/22/20 260 lb (117.9 kg)  05/13/20 263 lb (119.3 kg)       Physical Exam Vitals reviewed.  Constitutional:      General: He is not in acute distress.    Appearance: Normal appearance. He is not ill-appearing, toxic-appearing or diaphoretic.  HENT:     Head: Normocephalic and atraumatic.  Right Ear: External ear normal.     Left Ear: External ear normal.     Mouth/Throat:     Mouth: Mucous membranes are moist.  Eyes:     General: No scleral icterus.       Right eye: No discharge.        Left eye: No discharge.     Extraocular Movements: Extraocular movements intact.  Neck:     Vascular: No carotid bruit.  Cardiovascular:     Rate and Rhythm: Normal rate and regular rhythm.     Pulses: Normal pulses.     Heart sounds: Normal heart sounds. No murmur heard.   No friction rub. No gallop.  Pulmonary:     Effort: Pulmonary effort is normal. No respiratory distress.     Breath sounds: Normal breath sounds. No stridor. No wheezing, rhonchi or  rales.  Chest:     Chest wall: No tenderness.  Abdominal:     General: There is no distension.     Palpations: Abdomen is soft.     Tenderness: There is no abdominal tenderness.  Musculoskeletal:        General: Normal range of motion.     Cervical back: Normal range of motion and neck supple. No rigidity or tenderness.  Lymphadenopathy:     Cervical: No cervical adenopathy.  Skin:    General: Skin is warm.     Capillary Refill: Capillary refill takes less than 2 seconds.     Findings: No erythema.  Neurological:     Mental Status: He is oriented to person, place, and time.  Psychiatric:        Mood and Affect: Mood normal.        Behavior: Behavior normal.        Thought Content: Thought content normal.        Judgment: Judgment normal.      No results found for any visits on 10/20/20.  Assessment & Plan     1. Anxiety I think all the patient's symptoms are related to his anxiety. Continue to avoid alcohol. - CBC w/Diff/Platelet - Comprehensive Metabolic Panel (CMET) - Testosterone  2. Essential hypertension  - CBC w/Diff/Platelet - Comprehensive Metabolic Panel (CMET) - Testosterone  3. Hypotestosteronemia in male  - Testosterone  4. Panic attacks  - CBC w/Diff/Platelet - Comprehensive Metabolic Panel (CMET) - Testosterone  5. Polyneuropathy Refer to neurology for evaluation. - CBC w/Diff/Platelet - Comprehensive Metabolic Panel (CMET) - Testosterone  6. Dizziness Nonfocal exam. - CBC w/Diff/Platelet - Comprehensive Metabolic Panel (CMET) - Testosterone - Ambulatory referral to Neurology  7. Numbness and tingling  - Ambulatory referral to Neurology   Return in about 4 months (around 02/19/2021).      I, Megan Mans, MD, have reviewed all documentation for this visit. The documentation on 10/25/20 for the exam, diagnosis, procedures, and orders are all accurate and complete.    Brice Potteiger Wendelyn Breslow, MD  [facility] [phone] (phone) [phone] (fax)  [facility]

## 2020-10-23 LAB — TESTOSTERONE: Testosterone: 352 ng/dL (ref 264–916)

## 2020-10-23 LAB — CBC WITH DIFFERENTIAL/PLATELET
Basophils Absolute: 0 10*3/uL (ref 0.0–0.2)
Basos: 1 %
EOS (ABSOLUTE): 0.2 10*3/uL (ref 0.0–0.4)
Eos: 3 %
Hematocrit: 51.3 % — ABNORMAL HIGH (ref 37.5–51.0)
Hemoglobin: 16.9 g/dL (ref 13.0–17.7)
Immature Grans (Abs): 0 10*3/uL (ref 0.0–0.1)
Immature Granulocytes: 0 %
Lymphocytes Absolute: 1.6 10*3/uL (ref 0.7–3.1)
Lymphs: 28 %
MCH: 30.2 pg (ref 26.6–33.0)
MCHC: 32.9 g/dL (ref 31.5–35.7)
MCV: 92 fL (ref 79–97)
Monocytes Absolute: 0.4 10*3/uL (ref 0.1–0.9)
Monocytes: 6 %
Neutrophils Absolute: 3.6 10*3/uL (ref 1.4–7.0)
Neutrophils: 62 %
Platelets: 216 10*3/uL (ref 150–450)
RBC: 5.6 x10E6/uL (ref 4.14–5.80)
RDW: 12 % (ref 11.6–15.4)
WBC: 5.8 10*3/uL (ref 3.4–10.8)

## 2020-10-23 LAB — COMPREHENSIVE METABOLIC PANEL
ALT: 22 IU/L (ref 0–44)
AST: 24 IU/L (ref 0–40)
Albumin/Globulin Ratio: 2 (ref 1.2–2.2)
Albumin: 4.5 g/dL (ref 4.0–5.0)
Alkaline Phosphatase: 75 IU/L (ref 44–121)
BUN/Creatinine Ratio: 22 — ABNORMAL HIGH (ref 9–20)
BUN: 22 mg/dL — ABNORMAL HIGH (ref 6–20)
Bilirubin Total: 0.4 mg/dL (ref 0.0–1.2)
CO2: 22 mmol/L (ref 20–29)
Calcium: 9.4 mg/dL (ref 8.7–10.2)
Chloride: 103 mmol/L (ref 96–106)
Creatinine, Ser: 1 mg/dL (ref 0.76–1.27)
Globulin, Total: 2.3 g/dL (ref 1.5–4.5)
Glucose: 102 mg/dL — ABNORMAL HIGH (ref 65–99)
Potassium: 4.6 mmol/L (ref 3.5–5.2)
Sodium: 140 mmol/L (ref 134–144)
Total Protein: 6.8 g/dL (ref 6.0–8.5)
eGFR: 100 mL/min/{1.73_m2} (ref >59)

## 2020-10-26 ENCOUNTER — Encounter: Payer: Self-pay | Admitting: Family Medicine

## 2020-10-26 DIAGNOSIS — R200 Anesthesia of skin: Secondary | ICD-10-CM

## 2020-10-26 DIAGNOSIS — G629 Polyneuropathy, unspecified: Secondary | ICD-10-CM

## 2020-10-26 DIAGNOSIS — R202 Paresthesia of skin: Secondary | ICD-10-CM

## 2020-11-13 ENCOUNTER — Other Ambulatory Visit: Payer: Self-pay | Admitting: Family Medicine

## 2020-11-13 DIAGNOSIS — I10 Essential (primary) hypertension: Secondary | ICD-10-CM

## 2020-11-13 NOTE — Telephone Encounter (Signed)
Valid encounter . Future visit in 3 months  

## 2020-11-23 ENCOUNTER — Telehealth: Payer: Self-pay | Admitting: Family Medicine

## 2020-11-23 ENCOUNTER — Ambulatory Visit: Payer: Self-pay | Admitting: Family Medicine

## 2020-11-23 NOTE — Telephone Encounter (Signed)
Pt is calling to report that the citalopram (CELEXA) 10 MG tablet [[id number]] Was dizziness, numbness in feet. Pt has stopped taking the medication for 3 days. And Today is best day that has had. And gradually has been improving everyday. Cb- --[phone]

## 2020-11-23 NOTE — Telephone Encounter (Signed)
FYI. Thanks.

## 2020-11-30 ENCOUNTER — Telehealth: Payer: Self-pay

## 2020-11-30 ENCOUNTER — Other Ambulatory Visit: Payer: Self-pay

## 2020-11-30 DIAGNOSIS — E559 Vitamin D deficiency, unspecified: Secondary | ICD-10-CM

## 2020-11-30 NOTE — Telephone Encounter (Signed)
[facility] refill request for the following medications: ° °Vitamin D, Ergocalciferol, (DRISDOL) 1.25 MG (50000 UNIT) CAPS capsule  ° °Please advise. ° °

## 2020-12-10 ENCOUNTER — Telehealth: Payer: Self-pay

## 2020-12-10 NOTE — Telephone Encounter (Signed)
Copied from CRM [id number]. Topic: General - Other >> Dec 10, 2020  2:03 PM Pawlus, Maxine Glenn A wrote: Reason for CRM: Pt called in asking for a doctors note for work, pt stated he sent a MyChart message a few weeks ago and Dr Sullivan Lone knows about his health.

## 2020-12-11 ENCOUNTER — Other Ambulatory Visit: Payer: Self-pay | Admitting: *Deleted

## 2020-12-11 NOTE — Telephone Encounter (Signed)
Please advise 

## 2020-12-14 ENCOUNTER — Other Ambulatory Visit: Payer: Self-pay

## 2020-12-14 ENCOUNTER — Telehealth: Payer: Self-pay | Admitting: Family Medicine

## 2020-12-14 ENCOUNTER — Ambulatory Visit (INDEPENDENT_AMBULATORY_CARE_PROVIDER_SITE_OTHER): Payer: Managed Care, Other (non HMO) | Admitting: Neurology

## 2020-12-14 ENCOUNTER — Encounter: Payer: Self-pay | Admitting: Neurology

## 2020-12-14 VITALS — BP 128/88 | HR 66 | Ht 74.0 in | Wt 260.0 lb

## 2020-12-14 DIAGNOSIS — R200 Anesthesia of skin: Secondary | ICD-10-CM

## 2020-12-14 MED ORDER — GABAPENTIN 100 MG PO CAPS: 100.0000 mg | ORAL_CAPSULE | Freq: Three times a day (TID) | ORAL | 0 refills | Status: DC

## 2020-12-14 NOTE — Patient Instructions (Addendum)
Trial of Gabapentin 100 mg nightly, increase as tolerated to 300 mg nightly  Continue your other medications  Follow up with your primary care physician regarding plantar foot pain  Return in 6 months

## 2020-12-14 NOTE — Progress Notes (Signed)
[facility]  PATIENT: Lawrence Andrews DOB: 1985-09-22  REFERRING CLINICIAN: Maple Hudson.,* HISTORY FROM: Patient  REASON FOR VISIT: Numbness in both legs and feet.    HISTORICAL  CHIEF COMPLAINT:  Chief Complaint  Patient presents with   New Patient (Initial Visit)    Room 12 - alone. Referral for polyneuropathy. Reports burning, numbness and tingling in bilateral feet. At times, he has the same symptoms in his arms. He tried gabapentin 100mg  one cap BID was not very helpful. He had mild improvement after stopping citalopram. Additionally, he is concerned about having intermittent dizziness. These spells are causing him anxiety.    HISTORY OF PRESENT ILLNESS:  This is a 35 year old gentleman with past medical history of anxiety/depression, hypertension and GERD who is presenting with complaint of numbness in both legs and feet and also in the upper extremities.  Patient said that the numbness has been going on for a while.  Initially started in both feet then involved the legs all the way to the knees and also the upper arms, starts on the shoulders and down the arms.  He stated that he discontinued his citalopram and his psychiatrist put him on a different medication that he does not remember the name and the numbness in the upper extremities improved but he still have numbness in both feet that goes up to the knees.  He also reported in the morning, he does have pain in the plantar aspect of both feet after waking up from sleep but the pain will improve after taking a couple steps but states that the numbness is constant.  He had tried gabapentin in the past, reports side effect of lightheadedness and recalled that he only tried for couple weeks and discontinued it.  Patient said that he worries a lot about the numbness and htat makes it worse.  He reported his sleep is good, usually get about 7 hours.  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  He denies any history of diabetes, denies any history of thyroid disease, his recent leg labs were checked showing a hemoglobin A1c of 5.4, TSH was normal, B12 was above 684 and his folate was normal at 8.5.   OTHER MEDICAL CONDITIONS: Anxiety, depression, hypertension, GERD.   REVIEW OF SYSTEMS: Full 14 system review of systems performed and negative with exception of: As noted in the HPI.  ALLERGIES: Allergies  Allergen Reactions   Alpha-Gal     Red meat - reports eating it again   Other     Fire ants and pollen    HOME MEDICATIONS: Outpatient Medications Prior to Visit  Medication Sig Dispense Refill   clonazePAM (KLONOPIN) 1 MG tablet Take 1 mg by mouth daily as needed.     metoprolol succinate (TOPROL-XL) 25 MG 24 hr tablet TAKE 1 TABLET (25 MG TOTAL) BY MOUTH DAILY. 30 tablet 1   pantoprazole (PROTONIX) 40 MG tablet Take 1 tablet (40 mg total) by mouth daily. 90 tablet 1   albuterol (VENTOLIN HFA) 108 (90 Base) MCG/ACT inhaler Inhale 2 puffs into the lungs every 6 (six) hours as needed for wheezing or shortness of breath. (Patient not taking: Reported on 07/22/2020) 8 g 0   benzonatate (TESSALON) 100 MG capsule Take 1 capsule (100 mg total) by mouth 2 (two) times daily as needed for cough. 20 capsule 0  busPIRone (BUSPAR) 10 MG tablet Take 10 mg by mouth 2 (two) times daily.     citalopram (CELEXA) 10 MG tablet TAKE 1 TABLET BY MOUTH EVERY DAY 90 tablet 1   cyclobenzaprine (FLEXERIL) 10 MG tablet Take 1 tablet (10 mg total) by mouth at bedtime as needed for muscle spasms. (Patient not taking: No sig reported) 30 tablet 0   doxycycline (VIBRA-TABS) 100 MG tablet Take 1 tablet (100 mg total) by mouth 2 (two) times daily. (Patient not taking: Reported on 07/22/2020) 42 tablet 0   gabapentin (NEURONTIN) 100 MG capsule Take 1 capsule (100 mg total) by mouth 2 (two) times daily. 60  capsule 0   predniSONE (STERAPRED UNI-PAK 21 TAB) 10 MG (21) TBPK tablet PO: Take 6 tablets on day 1:Take 5 tablets day 2:Take 4 tablets day 3: Take 3 tablets day 4:Take 2 tablets day five: 5 Take 1 tablet day 6 (Patient not taking: No sig reported) 21 tablet 0   Vitamin D, Ergocalciferol, (DRISDOL) 1.25 MG (50000 UNIT) CAPS capsule Take 1 capsule (50,000 Units total) by mouth every 7 (seven) days. (Patient not taking: Reported on 07/22/2020) 12 capsule 0   No facility-administered medications prior to visit.    PAST MEDICAL HISTORY: Past Medical History:  Diagnosis Date   Acid reflux    Allergy    Allergy to alpha-gal    Anxiety    Depression    Hypertension     PAST SURGICAL HISTORY: Past Surgical History:  Procedure Laterality Date   WISDOM TOOTH EXTRACTION      FAMILY HISTORY: Family History  Problem Relation Age of Onset   Hypertension Mother    Healthy Father    Stroke Maternal Grandfather     SOCIAL HISTORY: Social History   Socioeconomic History   Marital status: Married    Spouse name: Not on file   Number of children: 1   Years of education: some college   Highest education level: Not on file  Occupational History   Occupation: Armed forces training and education officer  Tobacco Use   Smoking status: Never   Smokeless tobacco: Current    Types: Chew  Vaping Use   Vaping Use: Never used  Substance and Sexual Activity   Alcohol use: Yes    Comment: Less than 12 pack per week now ([place], [employer]) - no longer drinking daily   Drug use: No   Sexual activity: Not on file  Other Topics Concern   Not on file  Social History Narrative   Lives with family.   Right-handed.   No daily use of caffeine.   Social Determinants of Health   Financial Resource Strain: Not on file  Food Insecurity: Not on file  Transportation Needs: Not on file  Physical Activity: Not on file  Stress: Not on file  Social Connections: Not on file  Intimate Partner Violence: Not on file      PHYSICAL EXAM  GENERAL EXAM/CONSTITUTIONAL: Vitals:  Vitals:   12/14/20 0814  BP: 128/88  Pulse: 66  Weight: 260 lb (117.9 kg)  Height: 6\' 2"  (1.88 m)   Body mass index is 33.38 kg/m. Wt Readings from Last 3 Encounters:  12/14/20 260 lb (117.9 kg)  10/20/20 259 lb (117.5 kg)  07/22/20 260 lb (117.9 kg)   Patient is in no distress; well developed, nourished and groomed; neck is supple  EYES: Pupils round and reactive to light, Visual fields full to confrontation, Extraocular movements intacts,   MUSCULOSKELETAL: Gait, strength, tone, movements noted  in Neurologic exam below  NEUROLOGIC: MENTAL STATUS:  No flowsheet data found. awake, alert, oriented to person, place and time recent and remote memory intact normal attention and concentration language fluent, comprehension intact, naming intact fund of knowledge appropriate  CRANIAL NERVE:  2nd, 3rd, 4th, 6th - pupils equal and reactive to light, visual fields full to confrontation, extraocular muscles intact, no nystagmus 5th - facial sensation symmetric 7th - facial strength symmetric 8th - hearing intact 9th - palate elevates symmetrically, uvula midline 11th - shoulder shrug symmetric 12th - tongue protrusion midline  MOTOR:  normal bulk and tone, full strength in the BUE, BLE  SENSORY:  normal and symmetric to light touch, pinprick, temperature, vibration  COORDINATION:  finger-nose-finger, fine finger movements normal  REFLEXES:  deep tendon reflexes present and symmetric  GAIT/STATION:  normal     DIAGNOSTIC DATA (LABS, IMAGING, TESTING) - I reviewed patient records, labs, notes, testing and imaging myself where available.  Lab Results  Component Value Date   WBC 5.8 10/22/2020   HGB 16.9 10/22/2020   HCT 51.3 (H) 10/22/2020   MCV 92 10/22/2020   PLT 216 10/22/2020      Component Value Date/Time   NA 140 10/22/2020 1053   K 4.6 10/22/2020 1053   CL 103 10/22/2020 1053   CO2 22  10/22/2020 1053   GLUCOSE 102 (H) 10/22/2020 1053   GLUCOSE 86 05/07/2020 2105   BUN 22 (H) 10/22/2020 1053   CREATININE 1.00 10/22/2020 1053   CALCIUM 9.4 10/22/2020 1053   PROT 6.8 10/22/2020 1053   ALBUMIN 4.5 10/22/2020 1053   AST 24 10/22/2020 1053   ALT 22 10/22/2020 1053   ALKPHOS 75 10/22/2020 1053   BILITOT 0.4 10/22/2020 1053   GFRNONAA >60 05/07/2020 2105   GFRAA 96 12/25/2019 0837   Lab Results  Component Value Date   CHOL 224 (H) 12/25/2019   HDL 57 12/25/2019   LDLCALC 138 (H) 12/25/2019   TRIG 163 (H) 12/25/2019   CHOLHDL 3.7 07/22/2019   Lab Results  Component Value Date   HGBA1C 5.4 07/22/2019   Lab Results  Component Value Date   VITAMINB12 483 05/14/2020   Lab Results  Component Value Date   TSH 1.810 05/14/2020      ASSESSMENT AND PLAN  35 y.o. year old male with past medical history of anxiety/depression, hypertension and GERD who is presenting with numbness in both feet up to knees and also numbness in the bilateral upper extremities.  Patient stated since discontinuing citalopram a month ago and starting a new antidepressant, numbness in the upper extremities resolved but he still have numbness in the lower extremities up to both knees.  He denies any history of diabetes, no history of thyroid disease, his recent labs including B12, folate, TSH and hemoglobin A1c were all within normal limits.  He denies any history of knee pain, no back pain, but reports a history of mixed martial art.  On exam he does have a normal sensation to light touch, pinprick,  and vibration in his reflexes were all symmetric and normal.  His numbness is currently not explained by any systemic chronic condition.  I do believe that it is worsened by his anxiety as patient noted that he worries a lot about it and it makes his numbness worse.  Since starting a new antidepressant it seems like the numbness improved.  I will try him again on gabapentin nightly and to increase as  tolerated up to 300 mg.  I also asked patient to contact me to update me about the efficacy of the medication.  I will see him in 6 months for follow-up.   1. Numbness     PLAN: Trial of Gabapentin 100 mg nightly, increase as tolerated to 300 mg nightly  Continue your other medications  Follow up with your primary care physician regarding plantar foot pain  Return in 6 months   No orders of the defined types were placed in this encounter.   Meds ordered this encounter  Medications   gabapentin (NEURONTIN) 100 MG capsule    Sig: Take 1 capsule (100 mg total) by mouth 3 (three) times daily.    Dispense:  90 capsule    Refill:  0    Return in about 6 months (around 06/14/2021).    Windell Norfolk, MD 12/14/2020, 9:00 AM  [facility] [address], [address] [place], [place] [address] [phone]

## 2020-12-14 NOTE — Telephone Encounter (Signed)
[facility] faxed refill request for the following medications:  pantoprazole (PROTONIX) 40 MG tablet   Please advise. 

## 2020-12-15 MED ORDER — PANTOPRAZOLE SODIUM 40 MG PO TBEC: 40.0000 mg | DELAYED_RELEASE_TABLET | Freq: Every day | ORAL | 1 refills | Status: DC

## 2020-12-15 NOTE — Addendum Note (Signed)
Addended by: Marlene Lard on: 12/15/2020 08:40 AM   Modules accepted: Orders

## 2020-12-20 ENCOUNTER — Other Ambulatory Visit: Payer: Self-pay | Admitting: Family Medicine

## 2020-12-20 DIAGNOSIS — I10 Essential (primary) hypertension: Secondary | ICD-10-CM

## 2020-12-20 NOTE — Telephone Encounter (Signed)
last RF 11/13/20 #30 1 RF-- should have enough med until 01/13/21

## 2020-12-28 ENCOUNTER — Other Ambulatory Visit: Payer: Self-pay | Admitting: Family

## 2020-12-28 ENCOUNTER — Other Ambulatory Visit: Payer: Self-pay | Admitting: Neurology

## 2020-12-28 DIAGNOSIS — S90466A Insect bite (nonvenomous), unspecified lesser toe(s), initial encounter: Secondary | ICD-10-CM

## 2020-12-28 DIAGNOSIS — W57XXXA Bitten or stung by nonvenomous insect and other nonvenomous arthropods, initial encounter: Secondary | ICD-10-CM

## 2021-01-19 ENCOUNTER — Other Ambulatory Visit: Payer: Self-pay | Admitting: Family Medicine

## 2021-01-19 DIAGNOSIS — I10 Essential (primary) hypertension: Secondary | ICD-10-CM

## 2021-02-23 ENCOUNTER — Ambulatory Visit: Payer: Self-pay | Admitting: Family Medicine

## 2021-02-23 ENCOUNTER — Ambulatory Visit: Payer: Managed Care, Other (non HMO) | Admitting: Family Medicine

## 2021-03-03 ENCOUNTER — Ambulatory Visit: Payer: Managed Care, Other (non HMO) | Admitting: Family Medicine

## 2021-03-16 ENCOUNTER — Other Ambulatory Visit: Payer: Self-pay

## 2021-03-16 ENCOUNTER — Encounter: Payer: Self-pay | Admitting: Family Medicine

## 2021-03-16 ENCOUNTER — Ambulatory Visit (INDEPENDENT_AMBULATORY_CARE_PROVIDER_SITE_OTHER): Payer: Managed Care, Other (non HMO) | Admitting: Family Medicine

## 2021-03-16 VITALS — BP 119/79 | HR 69 | Temp 98.3°F | Resp 16 | Wt 262.0 lb

## 2021-03-16 DIAGNOSIS — F419 Anxiety disorder, unspecified: Secondary | ICD-10-CM | POA: Diagnosis not present

## 2021-03-16 DIAGNOSIS — I10 Essential (primary) hypertension: Secondary | ICD-10-CM | POA: Diagnosis not present

## 2021-03-16 DIAGNOSIS — G629 Polyneuropathy, unspecified: Secondary | ICD-10-CM | POA: Diagnosis not present

## 2021-03-16 DIAGNOSIS — K219 Gastro-esophageal reflux disease without esophagitis: Secondary | ICD-10-CM | POA: Diagnosis not present

## 2021-03-16 NOTE — Progress Notes (Signed)
° °  SUBJECTIVE:   CHIEF COMPLAINT / HPI:   Hypertension: - Medications: metoprolol  - Compliance: good - Checking BP at home: none - Denies any LE edema, medication SEs, or symptoms of hypotension  Anxiety/Depression - Medications: cymbalta, klonopin. Previously on effexor, citalopram.  - sees Psychiatry, next appt 2 months. - Taking: klonopin few times per week.  - Counseling: no - Symptoms: fatigue - Current stressors: job - Coping Mechanisms: drinking, beer case lasts 2 days. drinks 2-3 days per week.  GAD 7 : Generalized Anxiety Score 03/16/2021 07/22/2020 12/24/2019 11/26/2019  Nervous, Anxious, on Edge 1 1 3 2   Control/stop worrying 2 1 3 3   Worry too much - different things 2 1 3 2   Trouble relaxing 2 2 3 3   Restless 1 0 2 2  Easily annoyed or irritable 1 0 3 2  Afraid - awful might happen 1 1 3 2   Total GAD 7 Score 10 6 20 16   Anxiety Difficulty Somewhat difficult Somewhat difficult Very difficult Not difficult at all    Neuropathy - b/l lower extremitites. Previoulsy with upper extremities, improved with cessation of citalopram.  - saw Neuro 12/2020. Negative lab w/u. Anxiety thought to be contributing. Started gabapentin with plans to tirate for effect.  - tried gabapentin 100mg  TID, hasn't taken since a month after neuro appt. Did not feel it worked but did not try titration.   GERD - Meds: protonix daily, gaviscon prn, tums prn - Symptoms:  acid reflux.  - lots of alcohol, black coffee, spicy foods.  - denies dysphagia has not lost weight denies melena, hematochezia, hematemesis, and coffee ground emesis.  - Previous treatment: antacids and proton pump inhibitors. - chews tobacco, no cigarette use.   OBJECTIVE:   BP 119/79 (BP Location: Left Arm, Patient Position: Sitting, Cuff Size: Large)    Pulse 69    Temp 98.3 F (36.8 C) (Oral)    Resp 16    Wt 262 lb (118.8 kg)    BMI 33.64 kg/m   Gen: well appearing, in NAD Card: RRR Lungs: CTAB Abd: soft, NTND,  +BS. No organomegaly Ext: WWP, no edema   ASSESSMENT/PLAN:   Essential hypertension Doing well on current regimen, no changes made today.  Gastroesophageal reflux disease without esophagitis Worsened. Recommend increasing protonix to 80mg  daily and avoiding known triggers. Recommend alcohol and tobacco cessation and weight loss. Given symptomatic worsening and lack of effect with daily PPI, known extensive alcohol use and obesity, refer to GI for eval and possible UGI.   Anxiety Chronic, worsened. On max dose of cymbalta. Has upcoming appt with Psych, would likely benefit from dose/regimen adjustment. Will hopefully see benefit from self-titration of gabapentin.  Healthy coping mechanisms discussed today.  Neuropathy With prior negative w/u. Recommend titration of gabapentin as recommended by Neuro, continue to followup, due in April.      , DO

## 2021-03-16 NOTE — Assessment & Plan Note (Signed)
Doing well on current regimen, no changes made today. 

## 2021-03-16 NOTE — Patient Instructions (Signed)
It was great to see you!  Our plans for today:  - Let us know if you don't hear about an appointment with the GI doctor in the next few weeks.  - Increase your protonix to 80mg  daily. - Try increasing your gabapentin to 200mg .   We are checking some labs today, we will release these results to your MyChart.  Take care and seek immediate care sooner if you develop any concerns.   Dr. 

## 2021-03-16 NOTE — Assessment & Plan Note (Signed)
Chronic, worsened. On max dose of cymbalta. Has upcoming appt with Psych, would likely benefit from dose/regimen adjustment. Will hopefully see benefit from self-titration of gabapentin.  Healthy coping mechanisms discussed today.

## 2021-03-16 NOTE — Assessment & Plan Note (Signed)
With prior negative w/u. Recommend titration of gabapentin as recommended by Neuro, continue to followup, due in April.

## 2021-03-16 NOTE — Assessment & Plan Note (Addendum)
Worsened. Recommend increasing protonix to 80mg  daily and avoiding known triggers. Recommend alcohol and tobacco cessation and weight loss. Given symptomatic worsening and lack of effect with daily PPI, known extensive alcohol use and obesity, refer to GI for eval and possible UGI.

## 2021-03-17 ENCOUNTER — Encounter: Payer: Self-pay | Admitting: Family Medicine

## 2021-03-17 LAB — CBC WITH DIFFERENTIAL/PLATELET
Basophils Absolute: 0.1 10*3/uL (ref 0.0–0.2)
Basos: 1 %
EOS (ABSOLUTE): 0.2 10*3/uL (ref 0.0–0.4)
Eos: 3 %
Hematocrit: 51.3 % — ABNORMAL HIGH (ref 37.5–51.0)
Hemoglobin: 17.1 g/dL (ref 13.0–17.7)
Immature Grans (Abs): 0 10*3/uL (ref 0.0–0.1)
Immature Granulocytes: 0 %
Lymphocytes Absolute: 1.6 10*3/uL (ref 0.7–3.1)
Lymphs: 28 %
MCH: 30.6 pg (ref 26.6–33.0)
MCHC: 33.3 g/dL (ref 31.5–35.7)
MCV: 92 fL (ref 79–97)
Monocytes Absolute: 0.6 10*3/uL (ref 0.1–0.9)
Monocytes: 10 %
Neutrophils Absolute: 3.4 10*3/uL (ref 1.4–7.0)
Neutrophils: 58 %
Platelets: 197 10*3/uL (ref 150–450)
RBC: 5.58 x10E6/uL (ref 4.14–5.80)
RDW: 12.2 % (ref 11.6–15.4)
WBC: 5.9 10*3/uL (ref 3.4–10.8)

## 2021-03-17 LAB — COMPREHENSIVE METABOLIC PANEL
ALT: 26 IU/L (ref 0–44)
AST: 23 IU/L (ref 0–40)
Albumin/Globulin Ratio: 2 (ref 1.2–2.2)
Albumin: 4.7 g/dL (ref 4.0–5.0)
Alkaline Phosphatase: 72 IU/L (ref 44–121)
BUN/Creatinine Ratio: 16 (ref 9–20)
BUN: 17 mg/dL (ref 6–20)
Bilirubin Total: 0.5 mg/dL (ref 0.0–1.2)
CO2: 25 mmol/L (ref 20–29)
Calcium: 9.3 mg/dL (ref 8.7–10.2)
Chloride: 101 mmol/L (ref 96–106)
Creatinine, Ser: 1.07 mg/dL (ref 0.76–1.27)
Globulin, Total: 2.4 g/dL (ref 1.5–4.5)
Glucose: 102 mg/dL — ABNORMAL HIGH (ref 70–99)
Potassium: 5.1 mmol/L (ref 3.5–5.2)
Sodium: 139 mmol/L (ref 134–144)
Total Protein: 7.1 g/dL (ref 6.0–8.5)
eGFR: 92 mL/min/{1.73_m2} (ref >59)

## 2021-03-17 LAB — HEMOGLOBIN A1C
Est. average glucose Bld gHb Est-mCnc: 114 mg/dL
Hgb A1c MFr Bld: 5.6 % (ref 4.8–5.6)

## 2021-03-18 ENCOUNTER — Encounter: Payer: Self-pay | Admitting: Family Medicine

## 2021-04-07 ENCOUNTER — Other Ambulatory Visit: Payer: Self-pay

## 2021-04-08 ENCOUNTER — Ambulatory Visit (INDEPENDENT_AMBULATORY_CARE_PROVIDER_SITE_OTHER): Payer: Managed Care, Other (non HMO) | Admitting: Gastroenterology

## 2021-04-08 ENCOUNTER — Encounter: Payer: Self-pay | Admitting: Gastroenterology

## 2021-04-08 ENCOUNTER — Other Ambulatory Visit: Payer: Self-pay

## 2021-04-08 VITALS — BP 143/82 | HR 71 | Temp 97.9°F | Ht 74.0 in | Wt 257.1 lb

## 2021-04-08 DIAGNOSIS — K219 Gastro-esophageal reflux disease without esophagitis: Secondary | ICD-10-CM | POA: Diagnosis not present

## 2021-04-08 MED ORDER — OMEPRAZOLE 40 MG PO CPDR: 40.0000 mg | DELAYED_RELEASE_CAPSULE | Freq: Two times a day (BID) | ORAL | 0 refills | Status: DC

## 2021-04-08 NOTE — Patient Instructions (Signed)

## 2021-04-08 NOTE — Progress Notes (Signed)
Lawrence Repress, MD [address]  [address]  [place], [place] [address]  Main: [phone]  Fax: [phone]    Gastroenterology Consultation  Referring Provider:     Maple Andrews.,* Primary Care Physician:  Lawrence Andrews., MD Primary Gastroenterologist:  Dr. Arlyss Andrews Reason for Consultation:     Reflux        HPI:   Lawrence Andrews is a 36 y.o. male referred by Dr. Sullivan Andrews, Lawrence Andrews., MD  for consultation & management of reflux.  Patient reports that since 2021, he has been experiencing heartburn, regurgitation.  He was originally evaluated by [facility] clinic gastroenterology in 05/2019, underwent barium esophagogram for dysphagia, found to have mild reflux.  He has been on Protonix 40 mg daily since then.  Last 2 weeks, his reflux symptoms have worsened, increased to Protonix 40 mg twice daily by his PCP.  Patient reports that he had and was avoiding red meat for several months.  Lately, reintroduced recently and eats about twice a week.  He does drink coffee about 3-4 times a week.  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  He states that he generally worries about everything.  He does not drink sodas.  He has significantly cut back on alcohol use.  He used to chew tobacco which he stopped.  He reports having reflux symptoms during day and night, constant burning and discomfort in the retrosternal area.  He denies any difficulty swallowing. Patient has history of anxiety, depression He is a Consulting civil engineer for the headaches  NSAIDs: None  Antiplts/Anticoagulants/Anti thrombotics: None  GI Procedures: None  Past Medical History:  Diagnosis Date   Acid reflux    Allergy    Allergy to alpha-gal    Anxiety    COVID-19 03/31/2020   Depression    Hypertension     Past Surgical History:  Procedure Laterality Date   WISDOM TOOTH EXTRACTION     Current Outpatient Medications:    clonazePAM (KLONOPIN) 1 MG tablet, Take 1 mg by mouth  daily as needed., Disp: , Rfl:    DULoxetine (CYMBALTA) 60 MG capsule, SMARTSIG:1 Capsule(s) By Mouth Every Evening, Disp: , Rfl:    gabapentin (NEURONTIN) 100 MG capsule, Take 1 capsule (100 mg total) by mouth 3 (three) times daily., Disp: 90 capsule, Rfl: 0   metoprolol succinate (TOPROL-XL) 25 MG 24 hr tablet, TAKE 1 TABLET (25 MG TOTAL) BY MOUTH DAILY., Disp: 90 tablet, Rfl: 1   omeprazole (PRILOSEC) 40 MG capsule, Take 1 capsule (40 mg total) by mouth 2 (two) times daily before a meal., Disp: 60 capsule, Rfl: 0 1   Family History  Problem Relation Age of Onset   Hypertension Mother    Healthy Father    Stroke Maternal Grandfather    Diabetes Paternal Grandmother      Social History   Tobacco Use   Smoking status: Never   Smokeless tobacco: Current    Types: Chew  Vaping Use   Vaping Use: Never used  Substance Use Topics   Alcohol use: Yes    Comment: Less than 12 pack per week now ([place], Truly) - no longer drinking daily   Drug use: No    Allergies as of 04/08/2021 - Review Complete 04/08/2021  Allergen Reaction Noted   Alpha-gal  07/16/2019   Other  05/16/2019    Review of Systems:    All systems reviewed and negative except where noted  in HPI.   Physical Exam:  BP (!) 143/82 (BP Location: Left Arm, Patient Position: Sitting, Cuff Size: Normal)    Pulse 71    Temp 97.9 F (36.6 C) (Oral)    Ht 6\' 2"  (1.88 m)    Wt 257 lb 2 oz (116.6 kg)    BMI 33.01 kg/m  No LMP for male patient.  General:   Alert,  Well-developed, well-nourished, pleasant and cooperative in NAD Head:  Normocephalic and atraumatic. Eyes:  Sclera clear, no icterus.   Conjunctiva pink. Ears:  Normal auditory acuity. Nose:  No deformity, discharge, or lesions. Mouth:  No deformity or lesions,oropharynx pink & moist. Neck:  Supple; no masses or thyromegaly. Lungs:  Respirations even and unlabored.  Clear throughout to auscultation.   No wheezes, crackles, or rhonchi. No acute  distress. Heart:  Regular rate and rhythm; no murmurs, clicks, rubs, or gallops. Abdomen:  Normal bowel sounds. Soft, non-tender and non-distended without masses, hepatosplenomegaly or hernias noted.  No guarding or rebound tenderness.   Rectal: Not performed Msk:  Symmetrical without gross deformities. Good, equal movement & strength bilaterally. Pulses:  Normal pulses noted. Extremities:  No clubbing or edema.  No cyanosis. Neurologic:  Alert and oriented x3;  grossly normal neurologically. Skin:  Intact without significant lesions or rashes. No jaundice. Psych:  Alert and cooperative. Normal mood and affect.  Imaging Studies: Reviewed  Assessment and Plan:   [name] is a 36 y.o. male with history of anxiety, depression, chronic GERD is seen in consultation for flareup of GERD, history of allergy  Flareup of GERD Switch to Prilosec 40 mg p.o. twice daily before meals Discussed about antireflux lifestyle, information provided Strongly advised to eliminate coffee and red meat Recommend EGD for further evaluation   Follow up in 3 to 4 months   Cephas Darby, MD

## 2021-04-13 ENCOUNTER — Encounter: Payer: Self-pay | Admitting: Gastroenterology

## 2021-04-13 ENCOUNTER — Ambulatory Visit: Payer: Managed Care, Other (non HMO) | Admitting: Gastroenterology

## 2021-04-27 ENCOUNTER — Ambulatory Visit: Payer: Managed Care, Other (non HMO) | Admitting: Anesthesiology

## 2021-04-27 ENCOUNTER — Other Ambulatory Visit: Payer: Self-pay

## 2021-04-27 ENCOUNTER — Encounter: Payer: Self-pay | Admitting: Gastroenterology

## 2021-04-27 ENCOUNTER — Encounter
Admission: RE | Disposition: A | Discharge status: Home / Self Care | Payer: Self-pay | Source: Home / Self Care | Attending: Gastroenterology

## 2021-04-27 ENCOUNTER — Ambulatory Visit
Admission: RE | Admit: 2021-04-27 | Discharge: 2021-04-27 | Disposition: A | Discharge status: Home / Self Care | Payer: Managed Care, Other (non HMO) | Attending: Gastroenterology | Admitting: Gastroenterology

## 2021-04-27 ENCOUNTER — Other Ambulatory Visit: Payer: Self-pay | Admitting: Gastroenterology

## 2021-04-27 DIAGNOSIS — F418 Other specified anxiety disorders: Secondary | ICD-10-CM | POA: Insufficient documentation

## 2021-04-27 DIAGNOSIS — Z6832 Body mass index (BMI) 32.0-32.9, adult: Secondary | ICD-10-CM | POA: Insufficient documentation

## 2021-04-27 DIAGNOSIS — K219 Gastro-esophageal reflux disease without esophagitis: Secondary | ICD-10-CM

## 2021-04-27 DIAGNOSIS — I10 Essential (primary) hypertension: Secondary | ICD-10-CM | POA: Insufficient documentation

## 2021-04-27 DIAGNOSIS — E669 Obesity, unspecified: Secondary | ICD-10-CM | POA: Diagnosis not present

## 2021-04-27 HISTORY — PX: ESOPHAGOGASTRODUODENOSCOPY (EGD) WITH PROPOFOL: SHX5813

## 2021-04-27 SURGERY — ESOPHAGOGASTRODUODENOSCOPY (EGD) WITH PROPOFOL
Anesthesia: General | Site: Esophagus

## 2021-04-27 MED ORDER — LACTATED RINGERS IV SOLN: INTRAVENOUS | Status: DC

## 2021-04-27 MED ORDER — GLYCOPYRROLATE 0.2 MG/ML IJ SOLN
INTRAMUSCULAR | Status: DC | PRN
  Administered 2021-04-27: .1 mg via INTRAVENOUS

## 2021-04-27 MED ORDER — PROPOFOL 10 MG/ML IV BOLUS
INTRAVENOUS | Status: DC | PRN
Start: 2021-04-27 — End: 2021-04-27
  Administered 2021-04-27: 50 mg via INTRAVENOUS
  Administered 2021-04-27: 70 mg via INTRAVENOUS
  Administered 2021-04-27 (×2): 30 mg via INTRAVENOUS
  Administered 2021-04-27: 5 mg via INTRAVENOUS
  Administered 2021-04-27: 70 mg via INTRAVENOUS

## 2021-04-27 MED ORDER — LIDOCAINE HCL (CARDIAC) PF 100 MG/5ML IV SOSY
PREFILLED_SYRINGE | INTRAVENOUS | Status: DC | PRN
  Administered 2021-04-27: 60 mg via INTRAVENOUS

## 2021-04-27 MED ORDER — SODIUM CHLORIDE 0.9 % IV SOLN: INTRAVENOUS | Status: DC

## 2021-04-27 MED ORDER — ESOMEPRAZOLE MAGNESIUM 40 MG PO CPDR: 40.0000 mg | DELAYED_RELEASE_CAPSULE | Freq: Two times a day (BID) | ORAL | 1 refills | Status: DC

## 2021-04-27 SURGICAL SUPPLY — 8 items
BLOCK BITE 60FR ADLT L/F GRN (MISCELLANEOUS) ×2 IMPLANT
FORCEPS BIOP RAD 4 LRG CAP 4 (CUTTING FORCEPS) ×1 IMPLANT
GOWN CVR UNV OPN BCK APRN NK (MISCELLANEOUS) ×2 IMPLANT
GOWN ISOL THUMB LOOP REG UNIV (MISCELLANEOUS) ×4
KIT PRC NS LF DISP ENDO (KITS) ×1 IMPLANT
KIT PROCEDURE OLYMPUS (KITS) ×2
MANIFOLD NEPTUNE II (INSTRUMENTS) ×2 IMPLANT
WATER STERILE IRR 250ML POUR (IV SOLUTION) ×2 IMPLANT

## 2021-04-27 NOTE — Anesthesia Procedure Notes (Signed)
Date/Time: 04/27/2021 7:35 AM Performed by: Lily Kocher, CRNA Pre-anesthesia Checklist: Patient identified, Emergency Drugs available, Suction available, Patient being monitored and Timeout performed Patient Re-evaluated:Patient Re-evaluated prior to induction Oxygen Delivery Method: Nasal cannula Placement Confirmation: positive ETCO2

## 2021-04-27 NOTE — H&P (Signed)
Cephas Darby, MD [address]  [place]  [place], [place] [address]  Main: [phone]  Fax: [phone] Pager: [phone]  Primary Care Physician:  Jerrol Banana., MD Primary Gastroenterologist:  Dr. Cephas Darby  Pre-Procedure History & Physical: HPI:  Lawrence Andrews is a 36 y.o. male is here for an endoscopy.   Past Medical History:  Diagnosis Date   Acid reflux    Allergy    Allergy to alpha-gal    Anxiety    COVID-19 03/31/2020   Depression    Hypertension     Past Surgical History:  Procedure Laterality Date   WISDOM TOOTH EXTRACTION      Prior to Admission medications   Medication Sig Start Date End Date Taking? Authorizing Provider  clonazePAM (KLONOPIN) 1 MG tablet Take 1 mg by mouth daily as needed. 04/29/20  Yes [provider]  DULoxetine (CYMBALTA) 60 MG capsule SMARTSIG:1 Capsule(s) By Mouth Every Evening 02/12/21  Yes [provider]  gabapentin (NEURONTIN) 100 MG capsule Take 1 capsule (100 mg total) by mouth 3 (three) times daily. Patient taking differently: Take 100 mg by mouth 3 (three) times daily as needed. 12/14/20 04/13/21 Yes Camara, Maryan Puls, MD  metoprolol succinate (TOPROL-XL) 25 MG 24 hr tablet TAKE 1 TABLET (25 MG TOTAL) BY MOUTH DAILY. 01/19/21  Yes Jerrol Banana., MD  OVER THE COUNTER MEDICATION Beet Powder   Yes [provider]  Candelaria Arenas Alfa Brain   Yes [provider]  OVER THE COUNTER MEDICATION Mosses   Yes [provider]  pantoprazole (PROTONIX) 40 MG tablet Take 40 mg by mouth 2 (two) times daily.   Yes [provider]  omeprazole (PRILOSEC) 40 MG capsule Take 1 capsule (40 mg total) by mouth 2 (two) times daily before a meal. Patient not taking: Reported on 04/13/2021 04/08/21 05/08/21  Lin Landsman, MD    Allergies as of 04/08/2021 - Review Complete 04/08/2021  Allergen Reaction Noted   Alpha-gal  07/16/2019   Other   05/16/2019    Family History  Problem Relation Age of Onset   Hypertension Mother    Healthy Father    Stroke Maternal Grandfather    Diabetes Paternal Grandmother     Social History   Socioeconomic History   Marital status: Married    Spouse name: Not on file   Number of children: 1   Years of education: some college   Highest education level: Not on file  Occupational History   Occupation: Air traffic controller  Tobacco Use   Smoking status: Never   Smokeless tobacco: Former    Types: Chew    Quit date: 03/13/2021  Vaping Use   Vaping Use: Never used  Substance and Sexual Activity   Alcohol use: Yes    Comment: Approx 4-6 per week now ([place], Truly) - no longer drinking daily   Drug use: No   Sexual activity: Not on file  Other Topics Concern   Not on file  Social History Narrative   Lives with family.   Right-handed.   No daily use of caffeine.   Social Determinants of Health   Financial Resource Strain: Not on file  Food Insecurity: Not on file  Transportation Needs: Not on file  Physical Activity: Not on file  Stress: Not on file  Social Connections: Not on file  Intimate Partner Violence: Not on file    Review of Systems: See HPI, otherwise negative  ROS  Physical Exam: BP 131/77    Pulse 63    Temp 97.7 F (36.5 C) (Temporal)    Resp 20    Ht 6\' 2"  (1.88 m)    Wt 116.1 kg    SpO2 100%    BMI 32.87 kg/m  General:   Alert,  pleasant and cooperative in NAD Head:  Normocephalic and atraumatic. Neck:  Supple; no masses or thyromegaly. Lungs:  Clear throughout to auscultation.    Heart:  Regular rate and rhythm. Abdomen:  Soft, nontender and nondistended. Normal bowel sounds, without guarding, and without rebound.   Neurologic:  Alert and  oriented x4;  grossly normal neurologically.  Impression/Plan: Lawrence Andrews is here for an endoscopy to be performed for flare of GERD  Risks, benefits, limitations, and alternatives regarding   endoscopy have been reviewed with the patient.  Questions have been answered.  All parties agreeable.   Lawrence Sear, MD  04/27/2021, 7:25 AM

## 2021-04-27 NOTE — Anesthesia Postprocedure Evaluation (Signed)
Anesthesia Post Note  Patient: Lawrence Andrews  Procedure(s) Performed: ESOPHAGOGASTRODUODENOSCOPY (EGD) WITH PROPOFOL (Esophagus)     Patient location during evaluation: PACU Anesthesia Type: General Level of consciousness: awake and alert Pain management: pain level controlled Vital Signs Assessment: post-procedure vital signs reviewed and stable Respiratory status: spontaneous breathing and nonlabored ventilation Cardiovascular status: blood pressure returned to baseline Postop Assessment: no apparent nausea or vomiting Anesthetic complications: no   No notable events documented.  Nadalyn Deringer [employer]

## 2021-04-27 NOTE — Op Note (Signed)
[facility] Patient Name: Lawrence Andrews Procedure Date: 04/27/2021 7:09 AM MRN: [id number] Account #: [id number] Date of Birth: November 11, 1985 Admit Type: Outpatient Age: 36 Room: [facility] OR ROOM 01 Gender: Male Note Status: Finalized Instrument Name: [id number] Procedure:             Upper GI endoscopy Indications:           Follow-up of gastro-esophageal reflux disease,                         Esophageal reflux symptoms that persist despite                         appropriate therapy Providers:             Toney Reil MD, MD Referring MD:          Ferdinand Lango. Sullivan Lone, MD (Referring MD) Medicines:             General Anesthesia Complications:         No immediate complications. Estimated blood loss: None. Procedure:             Pre-Anesthesia Assessment:                        - Prior to the procedure, a History and Physical was                         performed, and patient medications and allergies were                         reviewed. The patient is competent. The risks and                         benefits of the procedure and the sedation options and                         risks were discussed with the patient. All questions                         were answered and informed consent was obtained.                         Patient identification and proposed procedure were                         verified by the physician, the nurse, the                         anesthesiologist, the anesthetist and the technician                         in the pre-procedure area in the procedure room in the                         endoscopy suite. Mental Status Examination: alert and                         oriented. Airway Examination: normal oropharyngeal  airway and neck mobility. Respiratory Examination:                         clear to auscultation. CV Examination: normal.                         Prophylactic Antibiotics: The patient  does not require                         prophylactic antibiotics. Prior Anticoagulants: The                         patient has taken no previous anticoagulant or                         antiplatelet agents. ASA Grade Assessment: II - A                         patient with mild systemic disease. After reviewing                         the risks and benefits, the patient was deemed in                         satisfactory condition to undergo the procedure. The                         anesthesia plan was to use general anesthesia.                         Immediately prior to administration of medications,                         the patient was re-assessed for adequacy to receive                         sedatives. The heart rate, respiratory rate, oxygen                         saturations, blood pressure, adequacy of pulmonary                         ventilation, and response to care were monitored                         throughout the procedure. The physical status of the                         patient was re-assessed after the procedure.                        After obtaining informed consent, the endoscope was                         passed under direct vision. Throughout the procedure,                         the patient's blood pressure, pulse, and oxygen  saturations were monitored continuously. The Endoscope                         was introduced through the mouth, and advanced to the                         second part of duodenum. The upper GI endoscopy was                         accomplished without difficulty. The patient tolerated                         the procedure well. Findings:      The duodenal bulb and second portion of the duodenum were normal.      The entire examined stomach was normal.      The cardia and gastric fundus were normal on retroflexion.      Esophagogastric landmarks were identified: the gastroesophageal junction       was  found at 40 cm from the incisors.      The gastroesophageal junction and examined esophagus were normal.       Biopsies were taken with a cold forceps for histology. Impression:            - Normal duodenal bulb and second portion of the                         duodenum.                        - Normal stomach.                        - Esophagogastric landmarks identified.                        - Normal gastroesophageal junction and esophagus.                         Biopsied. Recommendation:        - Await pathology results.                        - Discharge patient to home (with spouse).                        - Resume previous diet today.                        - Continue present medications.                        - Follow an antireflux regimen. Procedure Code(s):     --- Professional ---                        [id number], Esophagogastroduodenoscopy, flexible,                         transoral; with biopsy, single or multiple Diagnosis Code(s):     --- Professional ---  K21.9, Gastro-esophageal reflux disease without                         esophagitis CPT copyright 2019 American Medical Association. All rights reserved. The codes documented in this report are preliminary and upon coder review may  be revised to meet current compliance requirements. Dr. Libby Maw Toney Reil MD, MD 04/27/2021 7:50:32 AM This report has been signed electronically. Number of Addenda: 0 Note Initiated On: 04/27/2021 7:09 AM Total Procedure Duration: 0 hours 3 minutes 51 seconds  Estimated Blood Loss:  Estimated blood loss: none.      [facility]

## 2021-04-27 NOTE — Anesthesia Preprocedure Evaluation (Signed)
Anesthesia Evaluation  Patient identified by MRN, date of birth, ID band Patient awake    Reviewed: Allergy & Precautions, NPO status , Patient's Chart, lab work & pertinent test results  Airway Mallampati: II  TM Distance: >3 FB Neck ROM: Full    Dental no notable dental hx.    Pulmonary neg pulmonary ROS,    Pulmonary exam normal        Cardiovascular hypertension, Normal cardiovascular exam     Neuro/Psych PSYCHIATRIC DISORDERS Anxiety Depression    GI/Hepatic GERD  Controlled,  Endo/Other  BMI 32  Renal/GU negative Renal ROS     Musculoskeletal negative musculoskeletal ROS (+)   Abdominal (+) + obese,   Peds  Hematology negative hematology ROS (+)   Anesthesia Other Findings   Reproductive/Obstetrics negative OB ROS                             Anesthesia Physical Anesthesia Plan  ASA: 2  Anesthesia Plan: General   Post-op Pain Management: Minimal or no pain anticipated   Induction: Intravenous  PONV Risk Score and Plan: 2 and TIVA, Propofol infusion and Treatment may vary due to age or medical condition  Airway Management Planned: Nasal Cannula and Natural Airway  Additional Equipment: None  Intra-op Plan:   Post-operative Plan:   Informed Consent: I have reviewed the patients History and Physical, chart, labs and discussed the procedure including the risks, benefits and alternatives for the proposed anesthesia with the patient or authorized representative who has indicated his/her understanding and acceptance.     Dental advisory given  Plan Discussed with: CRNA  Anesthesia Plan Comments:         Anesthesia Quick Evaluation

## 2021-04-27 NOTE — Transfer of Care (Signed)
Immediate Anesthesia Transfer of Care Note  Patient: Lawrence Andrews  Procedure(s) Performed: ESOPHAGOGASTRODUODENOSCOPY (EGD) WITH PROPOFOL (Esophagus)  Patient Location: PACU  Anesthesia Type: General  Level of Consciousness: awake, alert  and patient cooperative  Airway and Oxygen Therapy: Patient Spontanous Breathing and Patient connected to supplemental oxygen  Post-op Assessment: Post-op Vital signs reviewed, Patient's Cardiovascular Status Stable, Respiratory Function Stable, Patent Airway and No signs of Nausea or vomiting  Post-op Vital Signs: Reviewed and stable  Complications: No notable events documented.

## 2021-04-28 ENCOUNTER — Encounter: Payer: Self-pay | Admitting: Gastroenterology

## 2021-04-28 NOTE — Telephone Encounter (Signed)
[place] PHONE

## 2021-04-28 NOTE — Telephone Encounter (Signed)
Called pharmacy to get patient pharmacy insurance information.  [employer] OF:[id number] ID UL:[id number] PCN [employer]  [employer]  \ REFERENCE NUMBER [phone]

## 2021-04-29 ENCOUNTER — Encounter: Payer: Self-pay | Admitting: Gastroenterology

## 2021-04-29 ENCOUNTER — Telehealth: Payer: Self-pay

## 2021-04-29 LAB — SURGICAL PATHOLOGY

## 2021-04-29 MED ORDER — ESOMEPRAZOLE MAGNESIUM 40 MG PO CPDR: 40.0000 mg | DELAYED_RELEASE_CAPSULE | Freq: Every day | ORAL | 1 refills | Status: DC

## 2021-04-29 NOTE — Telephone Encounter (Signed)
Sent in Nexium for once a day. Informed patient to try medication for 1 month and if still having symptoms to let us know and we can try getting medication sent in for twice a day and get insurance to approved

## 2021-04-29 NOTE — Telephone Encounter (Signed)
-----   Message from Toney Reil, MD sent at 04/29/2021 11:44 AM EST ----- Regarding: nexium If his insurance does not cover nexium 40mg  twice daily, we could try once daily before meals  RV

## 2021-05-23 ENCOUNTER — Other Ambulatory Visit: Payer: Self-pay | Admitting: Gastroenterology

## 2021-06-14 IMAGING — CR DG THORACIC SPINE 2V
1 series · 4 of 4 positions shown · non-contrast
Comparison: None.

CLINICAL DATA: Mid back pain, no injury.

EXAM:
THORACIC SPINE 2 VIEWS

[Series 1: dg thoracic spine 2 view · 0.14mm/px · 4 of 4 slices shown]
[im 1/4]
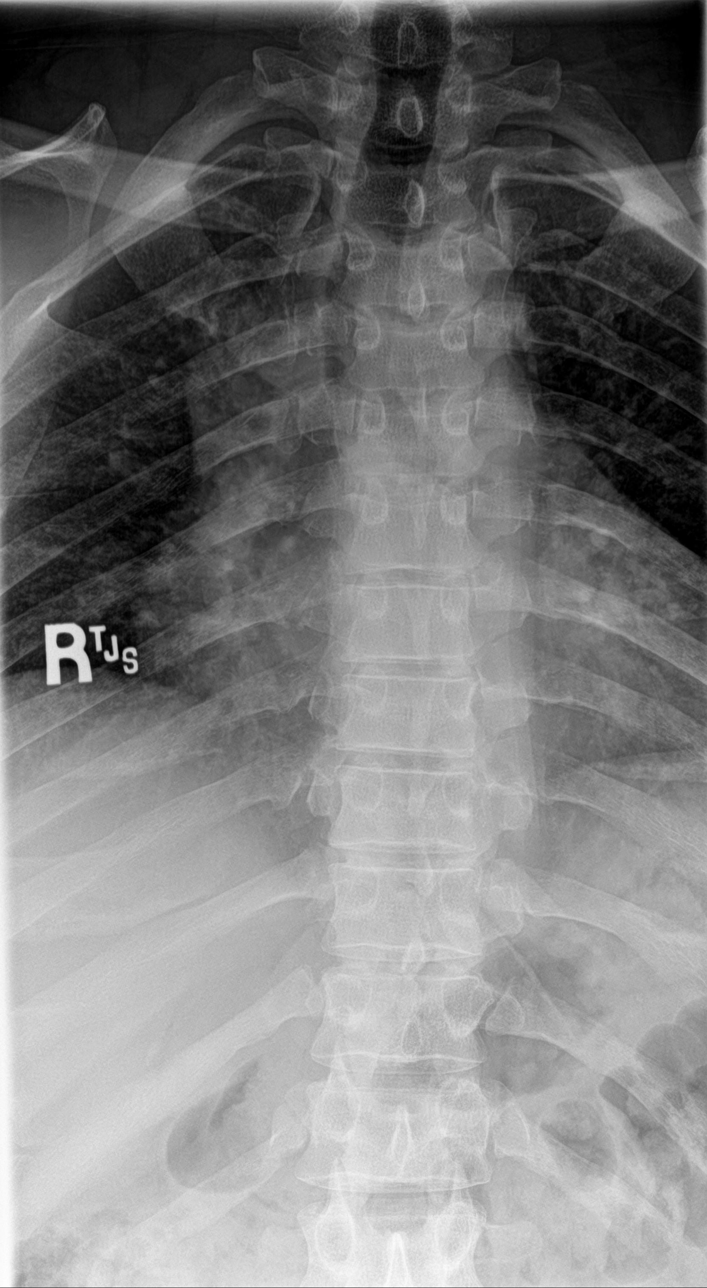
[im 2/4]
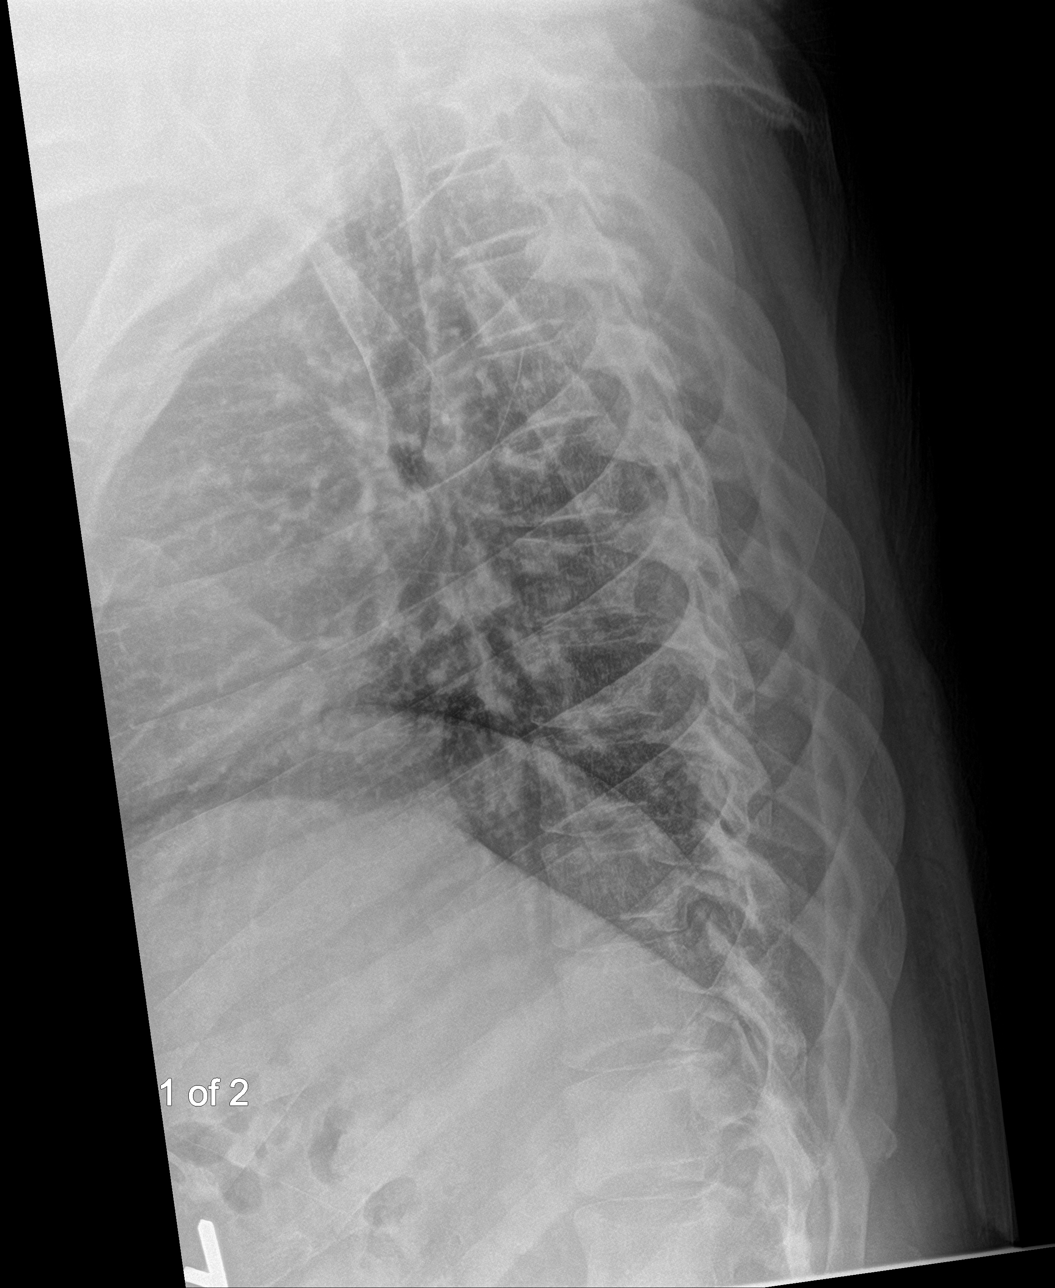
[im 3/4]
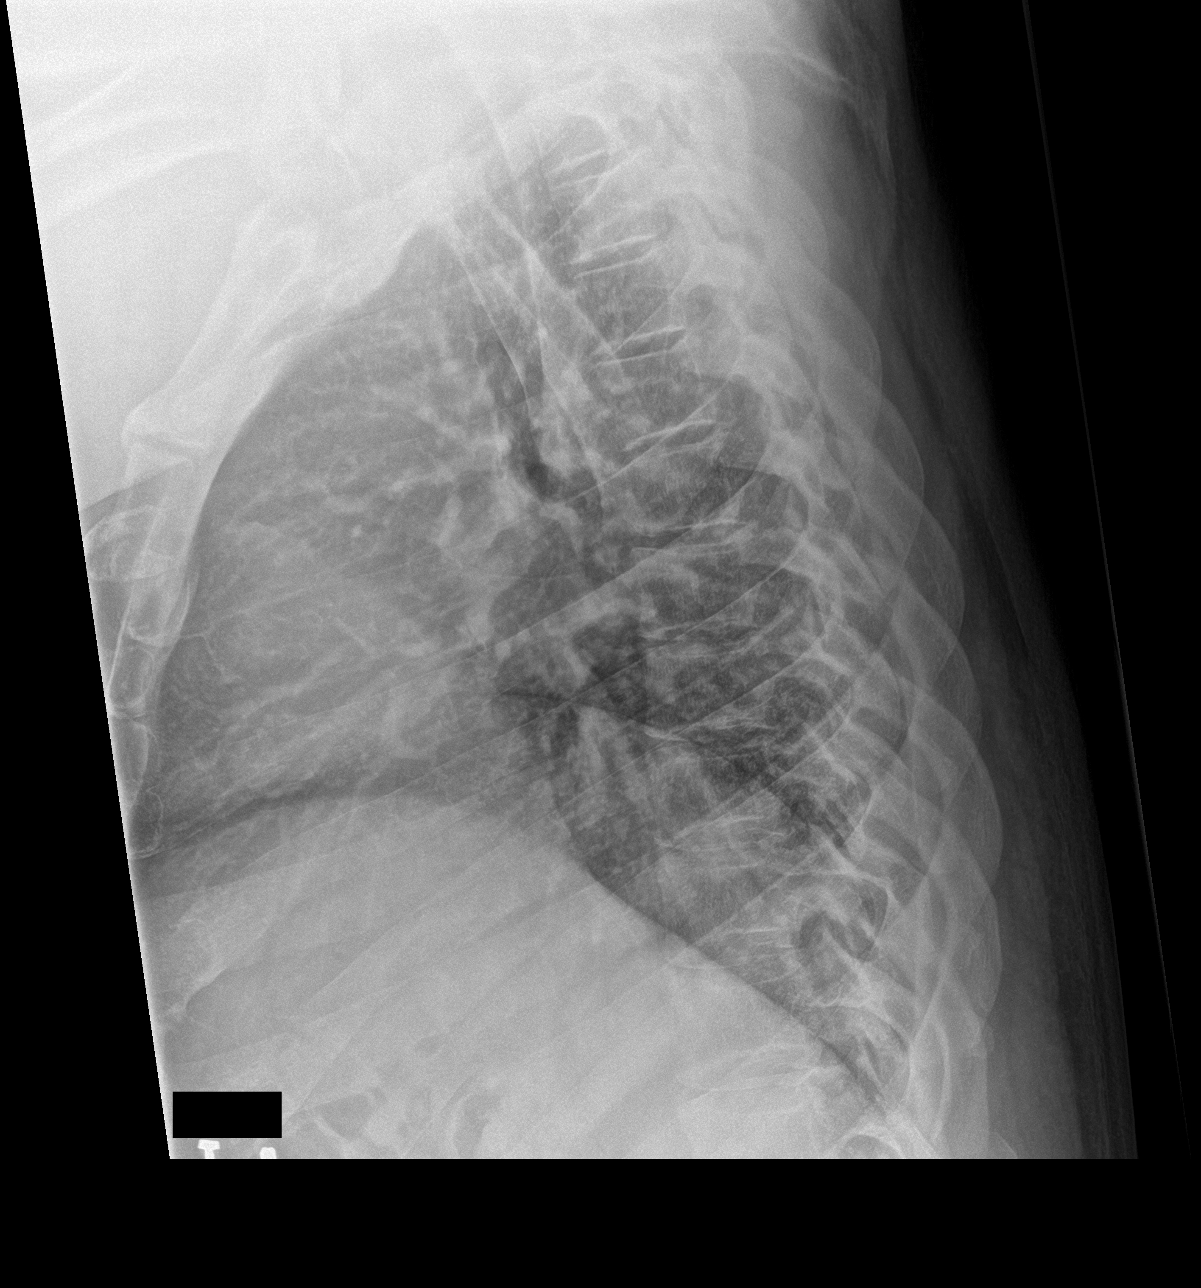
[im 4/4]
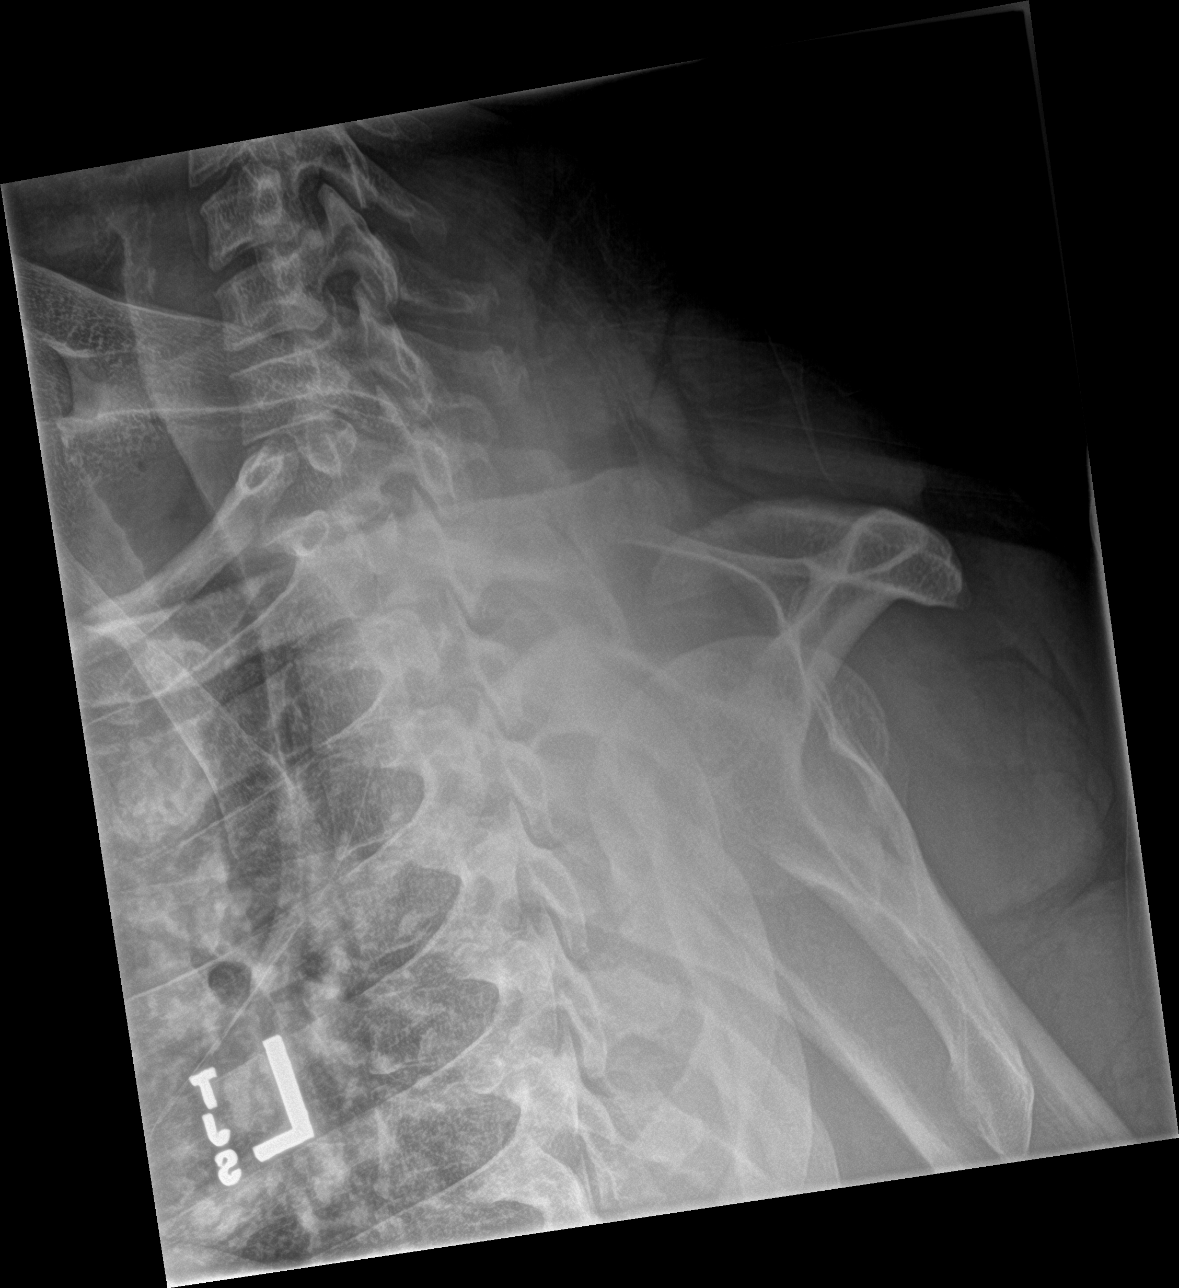

[4 of 4 positions shown; findings below may reference images not displayed]

FINDINGS: Alignment is anatomic. Vertebral body height is maintained. No
endplate degenerative changes.
IMPRESSION: No findings to explain the patient's pain.

## 2021-06-14 IMAGING — CR DG CERVICAL SPINE COMPLETE 4+V
1 series · 6 of 6 positions shown · non-contrast
Comparison: None.

CLINICAL DATA: Neck pain for 1 year with right arm radiculopathy.
No injury.

EXAM:
CERVICAL SPINE - COMPLETE 4+ VIEW

[Series 1: dg cervical spine complete · 0.14mm/px · 6 of 6 slices shown]
[im 1/6]
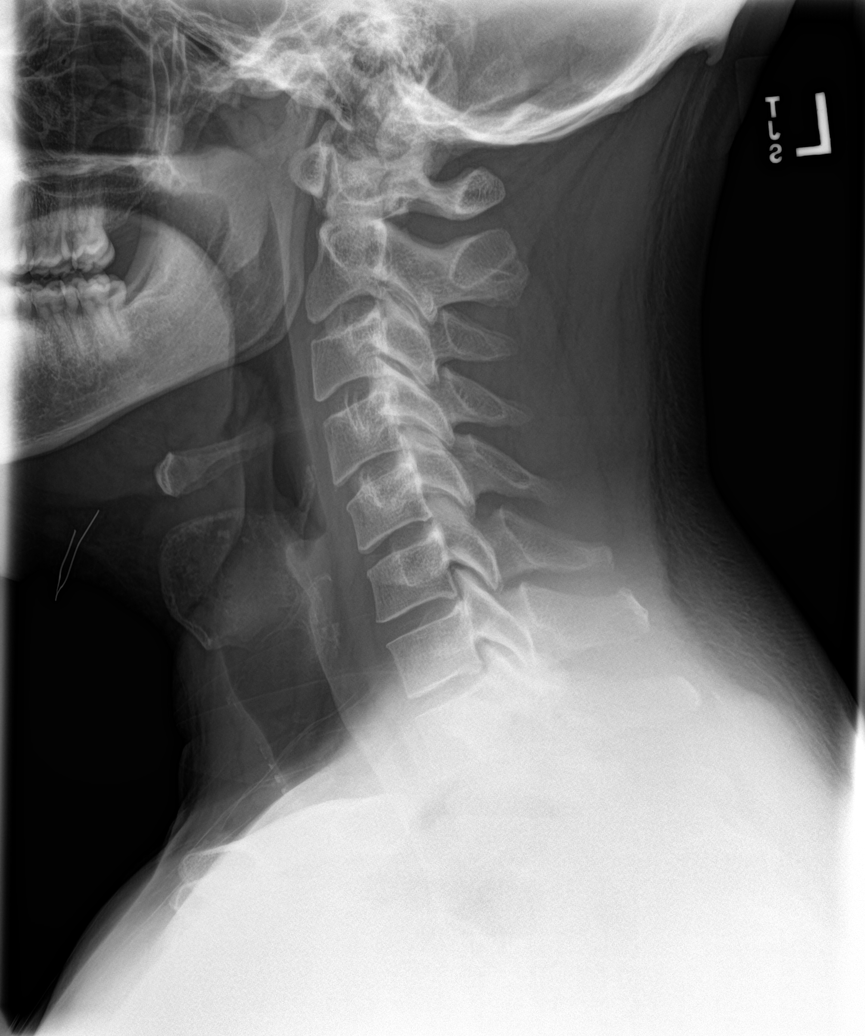
[im 2/6]
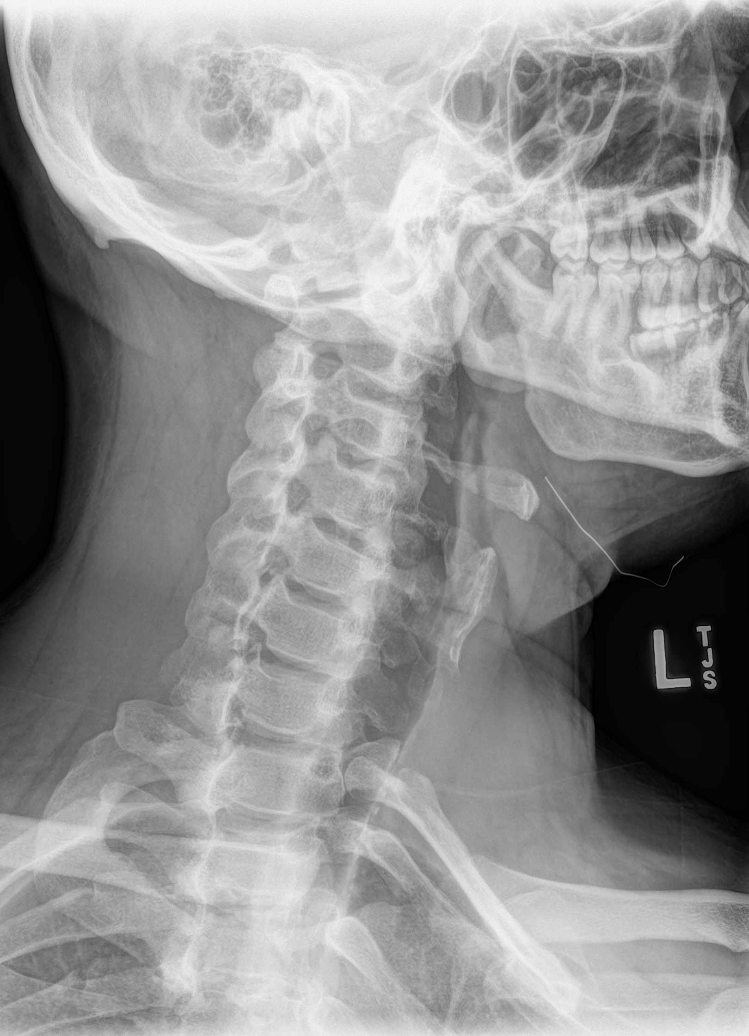
[im 3/6]
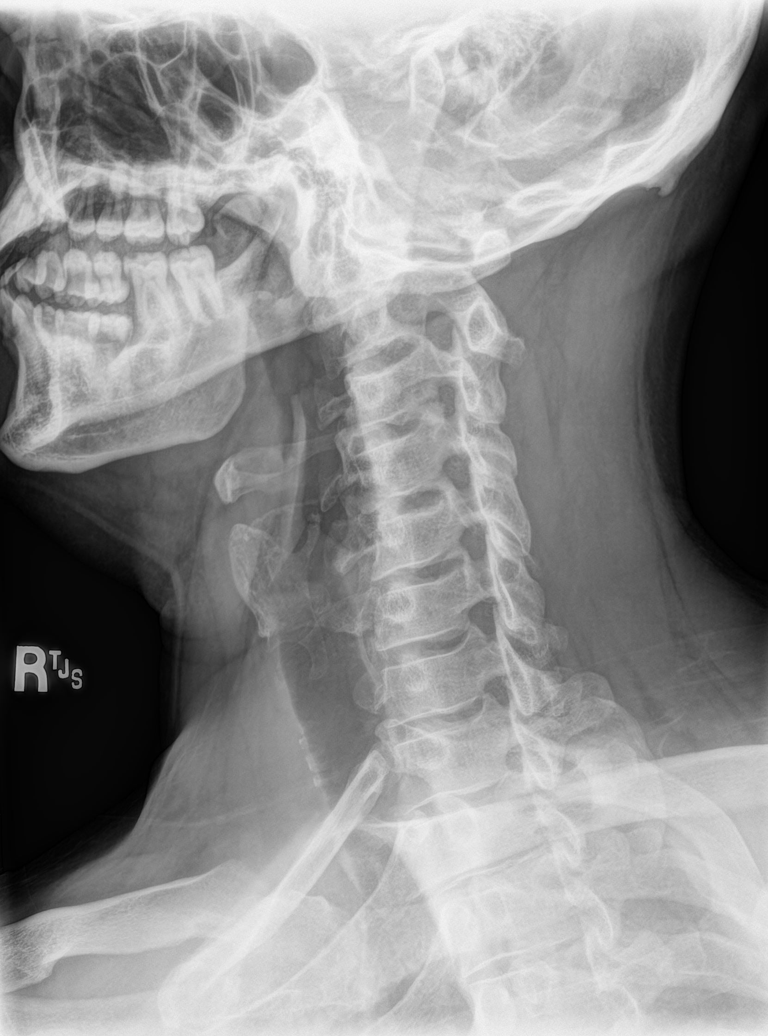
[im 4/6]
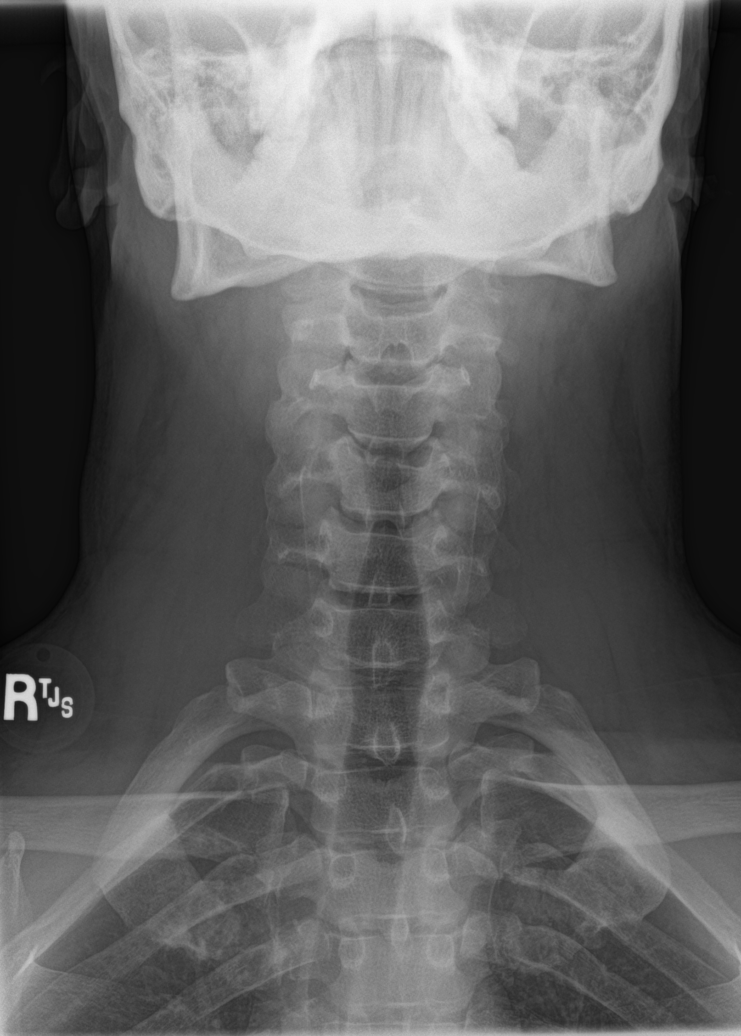
[im 5/6]
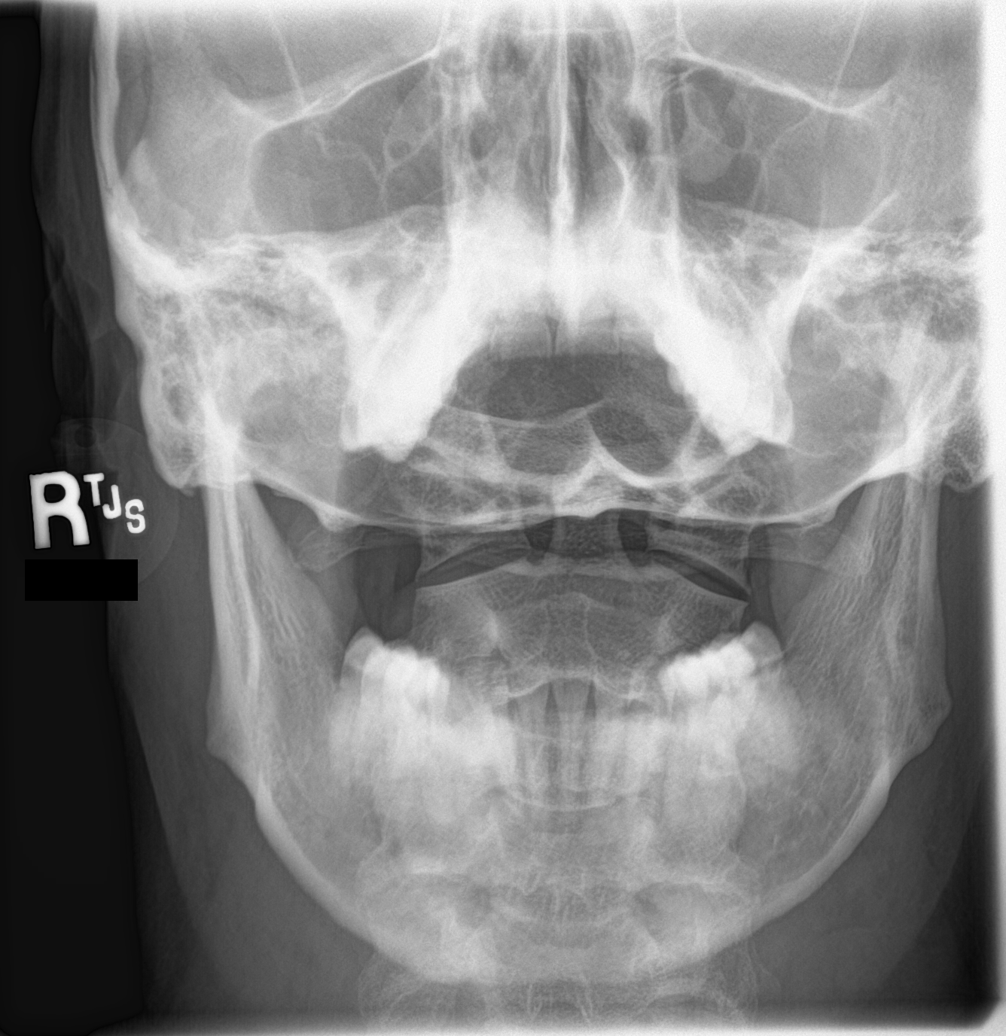
[im 6/6]
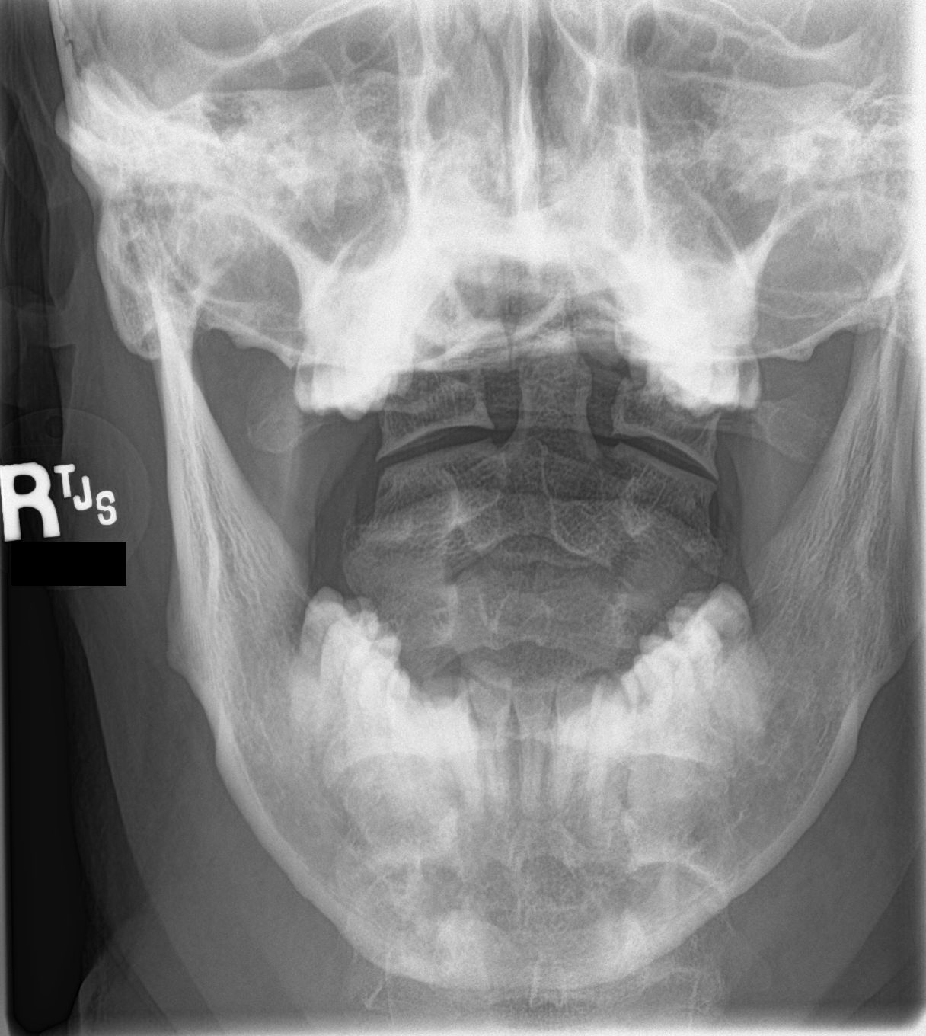

[6 of 6 positions shown; findings below may reference images not displayed]

FINDINGS: The cervical spine is visualized from the occiput to the
cervicothoracic junction. Slight straightening of the normal
lordosis in the lower cervical spine. There may be narrowing of the
right C5-6, C6-7 and C7-T1 neural foramina. On the left, the C3-4
neural foramina may be slightly narrowed. Otherwise, no degenerative
changes. Visualized lung apices are clear.
IMPRESSION: 1. Apparent neural foraminal narrowing on the right at C5-6, C6-7
and C7-T1 and on the left at C3-4.
2. Slight straightening of the normal lordosis in the lower cervical
spine.

## 2021-06-15 ENCOUNTER — Ambulatory Visit: Payer: Managed Care, Other (non HMO) | Admitting: Gastroenterology

## 2021-06-24 ENCOUNTER — Other Ambulatory Visit: Payer: Self-pay | Admitting: Gastroenterology

## 2021-06-25 ENCOUNTER — Other Ambulatory Visit: Payer: Self-pay | Admitting: Neurology

## 2021-06-29 NOTE — Telephone Encounter (Signed)
Rx refilled.

## 2021-07-08 ENCOUNTER — Ambulatory Visit: Payer: Managed Care, Other (non HMO) | Admitting: Gastroenterology

## 2021-07-13 ENCOUNTER — Ambulatory Visit: Payer: Managed Care, Other (non HMO) | Admitting: Family Medicine

## 2021-07-19 ENCOUNTER — Other Ambulatory Visit: Payer: Self-pay | Admitting: Gastroenterology

## 2021-08-08 ENCOUNTER — Other Ambulatory Visit: Payer: Self-pay | Admitting: Family Medicine

## 2021-08-08 DIAGNOSIS — I10 Essential (primary) hypertension: Secondary | ICD-10-CM

## 2021-08-18 ENCOUNTER — Encounter: Payer: Self-pay | Admitting: Emergency Medicine

## 2021-08-18 ENCOUNTER — Ambulatory Visit (INDEPENDENT_AMBULATORY_CARE_PROVIDER_SITE_OTHER): Payer: Managed Care, Other (non HMO)

## 2021-08-18 ENCOUNTER — Ambulatory Visit
Admission: EM | Admit: 2021-08-18 | Discharge: 2021-08-18 | Disposition: A | Discharge status: Home / Self Care | Payer: Managed Care, Other (non HMO)

## 2021-08-18 DIAGNOSIS — M25461 Effusion, right knee: Secondary | ICD-10-CM

## 2021-08-18 DIAGNOSIS — M25561 Pain in right knee: Secondary | ICD-10-CM

## 2021-08-18 NOTE — ED Triage Notes (Signed)
Pt hopped off a dock at work and landed weird 5 days ago. He's having right knee pain hurts when he bends his leg.

## 2021-08-18 NOTE — ED Provider Notes (Signed)
Lawrence Andrews    CSN: [id number] Arrival date & time: 08/18/21  1904      History   Chief Complaint Chief Complaint  Patient presents with   Knee Pain    HPI Lawrence Andrews is a 36 y.o. male.  Patient presents with pain and swelling of his right knee x5 days.  The pain started after he jumped and landed with his knee in an odd position at work.  The pain is worse with ambulation and weight bearing.  No open wounds, redness, bruising, numbness, weakness, or other symptoms.  Treatment at home with occasional use of ibuprofen.    The history is provided by the patient and medical records.   Past Medical History:  Diagnosis Date   Acid reflux    Allergy    Allergy to alpha-gal    Anxiety    COVID-19 03/31/2020   Depression    Hypertension     Patient Active Problem List   Diagnosis Date Noted   Neuropathy 03/16/2021   Bronchitis 03/31/2020   Acute non-recurrent sinusitis 02/13/2020   Otalgia 02/13/2020   Sinus pressure 02/13/2020   Radicular pain in right arm 01/28/2020   Chronic midline back pain 01/28/2020   Muscle spasm 12/24/2019   Allergy to alpha-gal 10/14/2019   Other allergic rhinitis 10/14/2019   Anxiety 07/16/2019   Body mass index (BMI) of 32.0-32.9 in adult 07/16/2019   Skin yeast infection 07/16/2019   Fatigue 07/16/2019   Vitamin B12 deficiency 07/16/2019   History of panic attacks 07/16/2019   Recurrent urticaria 04/16/2019   Angioedema 04/16/2019   Epistaxis 04/16/2019   Allergic reaction 04/16/2019   Palpitations 02/27/2019   Hyperlipidemia, mixed 02/27/2019   Snoring 07/23/2018   Low testosterone 07/23/2018   Hypotestosteronemia in male 07/07/2017   Vitamin D deficiency 07/05/2017   Tinea versicolor 07/05/2017   Chronic GERD 02/24/2017   Chewing tobacco nicotine dependence without complication 02/24/2017   Moderate episode of recurrent major depressive disorder (HCC) 08/10/2016   Panic attacks 05/20/2016   Generalized anxiety  disorder 05/20/2016   Essential hypertension 05/20/2016    Past Surgical History:  Procedure Laterality Date   ESOPHAGOGASTRODUODENOSCOPY (EGD) WITH PROPOFOL N/A 04/27/2021   Procedure: ESOPHAGOGASTRODUODENOSCOPY (EGD) WITH PROPOFOL;  Surgeon: Toney Reil, MD;  Location: [facility];  Service: Endoscopy;  Laterality: N/A;   WISDOM TOOTH EXTRACTION         Home Medications    Prior to Admission medications   Medication Sig Start Date End Date Taking? Authorizing Provider  anastrozole (ARIMIDEX) 1 MG tablet SMARTSIG:1 Tablet(s) By Mouth 07/20/21  Yes [provider]  DULoxetine (CYMBALTA) 60 MG capsule SMARTSIG:1 Capsule(s) By Mouth Every Evening 02/12/21  Yes [provider]  metoprolol succinate (TOPROL-XL) 25 MG 24 hr tablet TAKE 1 TABLET (25 MG TOTAL) BY MOUTH DAILY. 08/10/21  Yes Maple Hudson., MD  clonazePAM (KLONOPIN) 1 MG tablet Take 1 mg by mouth daily as needed. 04/29/20   [provider]  esomeprazole (NEXIUM) 40 MG capsule TAKE 1 CAPSULE BY MOUTH EVERY DAY BEFORE BREAKFAST 06/24/21   Vanga, Loel Dubonnet, MD  gabapentin (NEURONTIN) 100 MG capsule TAKE 1 CAPSULE (100 MG TOTAL) BY MOUTH THREE TIMES DAILY. 06/29/21   Windell Norfolk, MD  OVER THE COUNTER MEDICATION Beet Powder    [provider]  OVER THE COUNTER MEDICATION Alfa Brain    [provider]  OVER THE COUNTER MEDICATION Ambrosia Nectar    [provider]  pantoprazole (PROTONIX) 40 MG tablet Take 40 mg by mouth 2 (two) times daily.    [provider]    Family History Family History  Problem Relation Age of Onset   Hypertension Mother    Healthy Father    Stroke Maternal Grandfather    Diabetes Paternal Grandmother     Social History Social History   Tobacco Use   Smoking status: Never   Smokeless tobacco: Former    Types: Chew    Quit date: 03/13/2021  Vaping Use   Vaping Use: Never used  Substance Use Topics   Alcohol  use: Yes    Comment: Approx 4-6 per week now ([place], Truly) - no longer drinking daily   Drug use: No     Allergies   Alpha-gal and Other   Review of Systems Review of Systems  Constitutional:  Negative for chills and fever.  Musculoskeletal:  Positive for arthralgias, gait problem and joint swelling.  Skin:  Negative for color change, rash and wound.  Neurological:  Negative for weakness and numbness.  All other systems reviewed and are negative.   Physical Exam Triage Vital Signs ED Triage Vitals [08/18/21 1906]  Enc Vitals Group     BP      Pulse      Resp      Temp      Temp src      SpO2      Weight      Height      Head Circumference      Peak Flow      Pain Score 6     Pain Loc      Pain Edu?      Excl. in GC?    No data found.  Updated Vital Signs BP 139/85 (BP Location: Left Arm)   Pulse 87   Temp 99.6 F (37.6 C) (Oral)   Resp 16   SpO2 97%   Visual Acuity Right Eye Distance:   Left Eye Distance:   Bilateral Distance:    Right Eye Near:   Left Eye Near:    Bilateral Near:     Physical Exam Vitals and nursing note reviewed.  Constitutional:      General: He is not in acute distress.    Appearance: He is well-developed.  HENT:     Mouth/Throat:     Mouth: Mucous membranes are moist.  Cardiovascular:     Rate and Rhythm: Normal rate and regular rhythm.  Pulmonary:     Effort: Pulmonary effort is normal. No respiratory distress.  Musculoskeletal:        General: Swelling and tenderness present. No deformity.     Cervical back: Neck supple.       Legs:     Comments: FROM but increased pain with bending knee  Skin:    General: Skin is warm and dry.     Capillary Refill: Capillary refill takes less than 2 seconds.     Findings: No bruising, erythema, lesion or rash.  Neurological:     General: No focal deficit present.     Mental Status: He is alert and oriented to person, place, and time.     Sensory: No sensory deficit.      Motor: No weakness.     Gait: Gait abnormal.     Comments: Limping gait.  Psychiatric:        Mood and Affect: Mood normal.        Behavior: Behavior  normal.     UC Treatments / Results  Labs (all labs ordered are listed, but only abnormal results are displayed) Labs Reviewed - No data to display  EKG   Radiology DG Knee Complete 4 Views Right  Result Date: 08/18/2021 CLINICAL DATA:  Knee pain injury EXAM: RIGHT KNEE - COMPLETE 4+ VIEW COMPARISON:  None Available. FINDINGS: No fracture or malalignment. Moderate knee effusion. The joint spaces are patent IMPRESSION: Moderate knee effusion Electronically Signed   By: Jasmine Pang M.D.   On: 08/18/2021 19:24    Procedures Procedures (including critical care time)  Medications Ordered in UC Medications - No data to display  Initial Impression / Assessment and Plan / UC Course  I have reviewed the triage vital signs and the nursing notes.  Pertinent labs & imaging results that were available during my care of the patient were reviewed by me and considered in my medical decision making (see chart for details).    Right knee pain and effusion.  Treating with ibuprofen, rest, elevation, ice packs.  We do not have a knee sleeve here at this time that we will fit the patient.  He states he has an ace wrap at home to use this evening and will follow-up with orthopedics first thing in the morning.  He declines ace wrap here.  Instructed patient to follow up with orthopedics.  Work note provided for tomorrow.  He agrees to plan of care.   Final Clinical Impressions(s) / UC Diagnoses   Final diagnoses:  Acute pain of right knee  Effusion of right knee     Discharge Instructions      Take ibuprofen as directed.  Rest and elevate your knee.  Apply ice packs 2-3 times a day for up to 15 minutes each.  Wear the ace wrap as directed.  Follow up with an orthopedist.        ED Prescriptions   None    PDMP not reviewed this  encounter.   Mickie Bail, NP 08/18/21 [phone]

## 2021-08-18 NOTE — Discharge Instructions (Addendum)
Take ibuprofen as directed.  Rest and elevate your knee.  Apply ice packs 2-3 times a day for up to 15 minutes each.  Wear the ace wrap as directed.  Follow up with an orthopedist.

## 2021-08-19 ENCOUNTER — Other Ambulatory Visit: Payer: Self-pay | Admitting: Gastroenterology

## 2021-09-23 ENCOUNTER — Ambulatory Visit: Payer: Self-pay | Admitting: Dermatology

## 2021-11-04 ENCOUNTER — Other Ambulatory Visit: Payer: Self-pay | Admitting: Gastroenterology

## 2022-07-21 ENCOUNTER — Ambulatory Visit: Payer: BC Managed Care – PPO | Admitting: Dermatology

## 2022-07-21 VITALS — BP 138/74 | HR 92

## 2022-07-21 DIAGNOSIS — X32XXXA Exposure to sunlight, initial encounter: Secondary | ICD-10-CM

## 2022-07-21 DIAGNOSIS — L578 Other skin changes due to chronic exposure to nonionizing radiation: Secondary | ICD-10-CM

## 2022-07-21 DIAGNOSIS — L821 Other seborrheic keratosis: Secondary | ICD-10-CM | POA: Diagnosis not present

## 2022-07-21 DIAGNOSIS — L814 Other melanin hyperpigmentation: Secondary | ICD-10-CM

## 2022-07-21 DIAGNOSIS — W908XXA Exposure to other nonionizing radiation, initial encounter: Secondary | ICD-10-CM

## 2022-07-21 DIAGNOSIS — Z79899 Other long term (current) drug therapy: Secondary | ICD-10-CM

## 2022-07-21 DIAGNOSIS — B360 Pityriasis versicolor: Secondary | ICD-10-CM

## 2022-07-21 DIAGNOSIS — Z1283 Encounter for screening for malignant neoplasm of skin: Secondary | ICD-10-CM

## 2022-07-21 DIAGNOSIS — D229 Melanocytic nevi, unspecified: Secondary | ICD-10-CM

## 2022-07-21 MED ORDER — KETOCONAZOLE 2 % EX SHAM: MEDICATED_SHAMPOO | CUTANEOUS | 11 refills | Status: DC

## 2022-07-21 NOTE — Progress Notes (Signed)
New Patient Visit   Subjective  Lawrence Andrews is a 37 y.o. male who presents for the following: Skin Cancer Screening and Full Body Skin Exam Spots at chest, back, groin and neck. Denies family history of skin cancer.  The patient presents for Total-Body Skin Exam (TBSE) for skin cancer screening and mole check. The patient has spots, moles and lesions to be evaluated, some may be new or changing and the patient has concerns that these could be cancer.  The following portions of the chart were reviewed this encounter and updated as appropriate: medications, allergies, medical history  Review of Systems:  No other skin or systemic complaints except as noted in HPI or Assessment and Plan.  Objective  Well appearing patient in no apparent distress; mood and affect are within normal limits.  A full examination was performed including scalp, head, eyes, ears, nose, lips, neck, chest, axillae, abdomen, back, buttocks, bilateral upper extremities, bilateral lower extremities, hands, feet, fingers, toes, fingernails, and toenails. All findings within normal limits unless otherwise noted below.   Relevant physical exam findings are noted in the Assessment and Plan.       Assessment & Plan   Tinea Versicolor Multiple dyspigmented macules and patches with fine scale. Chronic and persistent condition with duration or expected duration over one year. Condition is bothersome/symptomatic for patient. Currently flared. Start Ketoconazole shampoo - use as a body wash 3 days per week let sit on skin for a few minutes before rinsing off. Use for 2 month. If improving after 2 months can use once monthly for maintenance  Will recheck in 4 months   Tinea versicolor is a chronic recurrent skin rash causing discolored scaly spots most commonly seen on back, chest, and/or shoulders.  It is generally asymptomatic. The rash is due to overgrowth of a common type of yeast present on everyone's skin and it  is not contagious.  It tends to flare more in the summer due to increased sweating on trunk.  After rash is treated, the scaliness will resolve, but the discoloration will take longer to return to normal pigmentation. The periodic use of an OTC medicated soap/shampoo with zinc or selenium sulfide can be helpful to prevent yeast overgrowth and recurrence.  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 Observe Patient reports there since birth, no change noticed.  Benign-appearing.  Observation.  Call clinic for new or changing lesions.  Recommend daily use of broad spectrum spf 30+ sunscreen to sun-exposed areas.   MELANOCYTIC NEVI - Tan-brown and/or pink-flesh-colored symmetric macules and papules - Benign appearing on exam today - Observation - Call clinic for new or changing moles - Recommend daily use of broad spectrum spf 30+ sunscreen to sun-exposed areas.   ACTINIC DAMAGE - Chronic condition, secondary to cumulative UV/sun exposure - diffuse scaly erythematous macules with underlying dyspigmentation - Recommend daily broad spectrum sunscreen SPF 30+ to sun-exposed areas, reapply every 2 hours as needed.  - Staying in the shade or wearing long sleeves, sun glasses (UVA+UVB protection) and wide brim hats (4-inch brim around the entire circumference of the hat) are also recommended for sun protection.  - Call for new or changing lesions.  SKIN CANCER SCREENING PERFORMED TODAY.  Tinea versicolor  Related Medications ketoconazole (NIZORAL) 2 % shampoo Use as a body wash to affected areas 3 days a week let sit  on skin for a few minutes before rinsing. Use for 2 months if improved continue once monthly.   Return in about 4 months (around 11/21/2022) for tinea versicolor follow up , 1 year tbse .  IAsher Muir, CMA, am  acting as scribe for Armida Sans, MD.  Documentation: I have reviewed the above documentation for accuracy and completeness, and I agree with the above.  Armida Sans, MD

## 2022-07-21 NOTE — Patient Instructions (Addendum)
Tinea versicolor is a chronic recurrent skin rash causing discolored scaly spots most commonly seen on back, chest, and/or shoulders.  It is generally asymptomatic. The rash is due to overgrowth of a common type of yeast present on everyone's skin and it is not contagious.  It tends to flare more in the summer due to increased sweating on trunk.  After rash is treated, the scaliness will resolve, but the discoloration will take longer to return to normal pigmentation. The periodic use of an OTC medicated soap/shampoo with zinc or selenium sulfide can be helpful to prevent yeast overgrowth and recurrence.   Start ketoconazole shampoo as body wash 3 days a week leave on skin for a few minutes before rinsing off, use for 2 months, if improved , can continue using but use once a month       Melanoma ABCDEs  Melanoma is the most dangerous type of skin cancer, and is the leading cause of death from skin disease.  You are more likely to develop melanoma if you: Have light-colored skin, light-colored eyes, or red or blond hair Spend a lot of time in the sun Tan regularly, either outdoors or in a tanning bed Have had blistering sunburns, especially during childhood Have a close family member who has had a melanoma Have atypical moles or large birthmarks  Early detection of melanoma is key since treatment is typically straightforward and cure rates are extremely high if we catch it early.   The first sign of melanoma is often a change in a mole or a new dark spot.  The ABCDE system is a way of remembering the signs of melanoma.  A for asymmetry:  The two halves do not match. B for border:  The edges of the growth are irregular. C for color:  A mixture of colors are present instead of an even brown color. D for diameter:  Melanomas are usually (but not always) greater than 6mm - the size of a pencil eraser. E for evolution:  The spot keeps changing in size, shape, and color.  Please check your skin  once per month between visits. You can use a small mirror in front and a large mirror behind you to keep an eye on the back side or your body.   If you see any new or changing lesions before your next follow-up, please call to schedule a visit.  Please continue daily skin protection including broad spectrum sunscreen SPF 30+ to sun-exposed areas, reapplying every 2 hours as needed when you're outdoors.   Staying in the shade or wearing long sleeves, sun glasses (UVA+UVB protection) and wide brim hats (4-inch brim around the entire circumference of the hat) are also recommended for sun protection.      Due to recent changes in healthcare laws, you may see results of your pathology and/or laboratory studies on MyChart before the doctors have had a chance to review them. We understand that in some cases there may be results that are confusing or concerning to you. Please understand that not all results are received at the same time and often the doctors may need to interpret multiple results in order to provide you with the best plan of care or course of treatment. Therefore, we ask that you please give [place] 2 business days to thoroughly review all your results before contacting the office for clarification. Should we see a critical lab result, you will be contacted sooner.   If You Need Anything After Your Visit  If you have any questions or concerns for your doctor, please call our main line at [phone] and press option 4 to reach your doctor's medical assistant. If no one answers, please leave a voicemail as directed and we will return your call as soon as possible. Messages left after 4 pm will be answered the following business day.   You may also send [place] a message via [place]. We typically respond to MyChart messages within 1-2 business days.  For prescription refills, please ask your pharmacy to contact our office. Our fax number is [phone].  If you have an urgent issue when the  clinic is closed that cannot wait until the next business day, you can page your doctor at the number below.    Please note that while we do our best to be available for urgent issues outside of office hours, we are not available 24/7.   If you have an urgent issue and are unable to reach [place], you may choose to seek medical care at your doctor's office, retail clinic, urgent care center, or emergency room.  If you have a medical emergency, please immediately call 911 or go to the emergency department.  Pager Numbers  - Dr. Nehemiah Massed: [phone]  - Dr. Laurence Ferrari: [phone]  - Dr. Nicole Kindred: [phone]  In the event of inclement weather, please call our main line at [phone] for an update on the status of any delays or closures.  Dermatology Medication Tips: Please keep the boxes that topical medications come in in order to help keep track of the instructions about where and how to use these. Pharmacies typically print the medication instructions only on the boxes and not directly on the medication tubes.   If your medication is too expensive, please contact our office at [phone] option 4 or send [place] a message through [place].   We are unable to tell what your co-pay for medications will be in advance as this is different depending on your insurance coverage. However, we may be able to find a substitute medication at lower cost or fill out paperwork to get insurance to cover a needed medication.   If a prior authorization is required to get your medication covered by your insurance company, please allow [place] 1-2 business days to complete this process.  Drug prices often vary depending on where the prescription is filled and some pharmacies may offer cheaper prices.  The website www.goodrx.com contains coupons for medications through different pharmacies. The prices here do not account for what the cost may be with help from insurance (it may be cheaper with your insurance), but the  website can give you the price if you did not use any insurance.  - You can print the associated coupon and take it with your prescription to the pharmacy.  - You may also stop by our office during regular business hours and pick up a GoodRx coupon card.  - If you need your prescription sent electronically to a different pharmacy, notify our office through [facility] or by phone at [phone] option 4.     Si Usted Necesita Algo Despus de Su Visita  Tambin puede enviarnos un mensaje a travs de Pharmacist, community. Por lo general respondemos a los mensajes de MyChart en el transcurso de 1 a 2 das hbiles.  Para renovar recetas, por favor pida a su farmacia que se ponga en contacto con nuestra oficina. Harland Dingwall de fax es [place] [phone].  Si tiene un asunto urgente cuando la clnica est  cerrada y que no puede esperar hasta el siguiente da hbil, puede llamar/localizar a su doctor(a) al nmero que aparece a continuacin.   Por favor, tenga en cuenta que aunque hacemos todo lo posible para estar disponibles para asuntos urgentes fuera del horario de [place], no estamos disponibles las 24 horas del da, los 7 [address]  [address] de la [place].   Si tiene un problema urgente y no puede comunicarse con nosotros, puede optar por buscar atencin mdica  en el consultorio de su doctor(a), en una clnica privada, en un centro de atencin urgente o en una sala de emergencias.  Si tiene Engineer, drilling, por favor llame inmediatamente al 911 o vaya a la sala de emergencias.  Nmeros de bper  - Dr. Gwen Pounds: [phone]  - Dra. Moye: [phone]  - Dra. Roseanne Reno: [phone]  En caso de inclemencias del [place], por favor llame a Lacy Duverney principal al [phone] para una actualizacin sobre el [place] de cualquier retraso o cierre.  Consejos para la medicacin en dermatologa: Por favor, guarde las cajas en las que vienen los medicamentos de uso tpico para ayudarle a seguir las  instrucciones sobre dnde y cmo usarlos. Las farmacias generalmente imprimen las instrucciones del medicamento slo en las cajas y no directamente en los tubos del [place].   Si su medicamento es muy caro, por favor, pngase en contacto con Rolm Gala llamando al [phone] y presione la opcin 4 o envenos un mensaje a travs de Clinical cytogeneticist.   No podemos decirle cul ser su copago por los medicamentos por adelantado ya que esto es diferente dependiendo de la cobertura de su seguro. Sin embargo, es posible que podamos encontrar un medicamento sustituto a Audiological scientist un formulario para que el seguro cubra el medicamento que se considera necesario.   Si se requiere una autorizacin previa para que su compaa de seguros [place] su medicamento, por favor permtanos de 1 a 2 das hbiles para completar [address]  Los precios de los medicamentos varan con frecuencia dependiendo del Environmental consultant de dnde se surte la receta y alguna farmacias pueden ofrecer precios ms baratos.  El sitio web www.goodrx.com tiene cupones para medicamentos de Health and safety inspector. Los precios aqu no tienen en cuenta lo que podra costar con la ayuda del seguro (puede ser ms barato con su seguro), pero el sitio web puede darle el precio si no utiliz Tourist information centre manager.  - Puede imprimir el cupn correspondiente y llevarlo con su receta a la farmacia.  - Tambin puede pasar por nuestra oficina durante el horario de atencin regular y Education officer, museum una tarjeta de cupones de GoodRx.  - Si necesita que su receta se enve electrnicamente a una farmacia diferente, informe a nuestra oficina a travs de MyChart de [place] o por telfono llamando al [phone] y presione la opcin 4.

## 2022-07-26 ENCOUNTER — Encounter: Payer: Self-pay | Admitting: Dermatology

## 2022-12-01 ENCOUNTER — Ambulatory Visit: Payer: BC Managed Care – PPO | Admitting: Dermatology

## 2023-04-12 ENCOUNTER — Ambulatory Visit: Payer: BC Managed Care – PPO | Admitting: Dermatology

## 2023-04-20 ENCOUNTER — Ambulatory Visit: Payer: BC Managed Care – PPO | Admitting: Dermatology

## 2023-07-13 ENCOUNTER — Ambulatory Visit: Admitting: Dermatology

## 2023-07-13 DIAGNOSIS — B360 Pityriasis versicolor: Secondary | ICD-10-CM | POA: Diagnosis not present

## 2023-07-13 DIAGNOSIS — L821 Other seborrheic keratosis: Secondary | ICD-10-CM

## 2023-07-13 DIAGNOSIS — D239 Other benign neoplasm of skin, unspecified: Secondary | ICD-10-CM

## 2023-07-13 DIAGNOSIS — L578 Other skin changes due to chronic exposure to nonionizing radiation: Secondary | ICD-10-CM | POA: Diagnosis not present

## 2023-07-13 DIAGNOSIS — Z1283 Encounter for screening for malignant neoplasm of skin: Secondary | ICD-10-CM | POA: Diagnosis not present

## 2023-07-13 DIAGNOSIS — D2271 Melanocytic nevi of right lower limb, including hip: Secondary | ICD-10-CM | POA: Diagnosis not present

## 2023-07-13 DIAGNOSIS — D1801 Hemangioma of skin and subcutaneous tissue: Secondary | ICD-10-CM

## 2023-07-13 DIAGNOSIS — L814 Other melanin hyperpigmentation: Secondary | ICD-10-CM

## 2023-07-13 DIAGNOSIS — W908XXA Exposure to other nonionizing radiation, initial encounter: Secondary | ICD-10-CM

## 2023-07-13 DIAGNOSIS — D492 Neoplasm of unspecified behavior of bone, soft tissue, and skin: Secondary | ICD-10-CM

## 2023-07-13 DIAGNOSIS — Z79899 Other long term (current) drug therapy: Secondary | ICD-10-CM

## 2023-07-13 DIAGNOSIS — Z7189 Other specified counseling: Secondary | ICD-10-CM

## 2023-07-13 DIAGNOSIS — D229 Melanocytic nevi, unspecified: Secondary | ICD-10-CM

## 2023-07-13 HISTORY — DX: Other benign neoplasm of skin, unspecified: D23.9

## 2023-07-13 MED ORDER — KETOCONAZOLE 2 % EX SHAM: MEDICATED_SHAMPOO | CUTANEOUS | 11 refills | Status: AC

## 2023-07-13 NOTE — Patient Instructions (Addendum)

## 2023-07-13 NOTE — Progress Notes (Unsigned)
 Follow-Up Visit   Subjective  Lawrence Andrews is a 38 y.o. male who presents for the following: Skin Cancer Screening and Full Body Skin Exam, hx of tinea versicolor treating with Ketoconazole  wash once a month with a good response.   The patient presents for Total-Body Skin Exam (TBSE) for skin cancer screening and mole check. The patient has spots, moles and lesions to be evaluated, some may be new or changing and the patient may have concern these could be cancer.  The following portions of the chart were reviewed this encounter and updated as appropriate: medications, allergies, medical history  Review of Systems:  No other skin or systemic complaints except as noted in HPI or Assessment and Plan.  Objective  Well appearing patient in no apparent distress; mood and affect are within normal limits.  A full examination was performed including scalp, head, eyes, ears, nose, lips, neck, chest, axillae, abdomen, back, buttocks, bilateral upper extremities, bilateral lower extremities, hands, feet, fingers, toes, fingernails, and toenails. All findings within normal limits unless otherwise noted below.   Relevant physical exam findings are noted in the Assessment and Plan.  right lateral knee 0.3 cm black macule        Assessment & Plan   SKIN CANCER SCREENING PERFORMED TODAY.  ACTINIC DAMAGE - Chronic condition, secondary to cumulative UV/sun exposure - diffuse scaly erythematous macules with underlying dyspigmentation - Recommend daily broad spectrum sunscreen SPF 30+ to sun-exposed areas, reapply every 2 hours as needed.  - Staying in the shade or wearing long sleeves, sun glasses (UVA+UVB protection) and wide brim hats (4-inch brim around the entire circumference of the hat) are also recommended for sun protection.  - Call for new or changing lesions.  LENTIGINES, SEBORRHEIC KERATOSES, HEMANGIOMAS - Benign normal skin lesions - Benign-appearing - Call for any  changes  MELANOCYTIC NEVI - Tan-brown and/or pink-flesh-colored symmetric macules and papules - Benign appearing on exam today - Observation - Call clinic for new or changing moles - Recommend daily use of broad spectrum spf 30+ sunscreen to sun-exposed areas.   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 Observe Patient reports there since birth, no change noticed.  Benign-appearing.  Observation.  Call clinic for new or changing lesions.  Recommend daily use of broad spectrum spf 30+ sunscreen to sun-exposed areas.   Tinea Versicolor Chest, back, neck  Chronic and persistent condition with duration or expected duration over one year. Condition is bothersome/symptomatic for patient. Currently flared. Start Ketoconazole  shampoo - use as a body wash 3 days per week let sit on skin for a few minutes before rinsing off. Use for 2 month. If improving after 2 months can use once monthly for maintenance  NEOPLASM OF SKIN right lateral knee Epidermal / dermal shaving  Lesion diameter (cm):  0.3 Informed consent: discussed and consent obtained   Timeout: patient name, date of birth, surgical site, and procedure verified   Procedure prep:  Patient was prepped and draped in usual sterile fashion Prep type:  Isopropyl alcohol Anesthesia: the lesion was anesthetized in a standard fashion   Anesthetic:  1% lidocaine  w/ epinephrine  1-100,000 buffered w/ 8.4% NaHCO3 Hemostasis achieved with: pressure, aluminum chloride and electrodesiccation   Outcome: patient tolerated procedure well   Post-procedure details: sterile dressing applied and wound care instructions given   Dressing type: bandage and petrolatum   Specimen 1 - Surgical pathology Differential Diagnosis: R/O Dysplastic nevus   Check Margins: No  TINEA VERSICOLOR   Related Medications ketoconazole  (NIZORAL ) 2 % shampoo Use as a body wash to affected areas 3 days a week let sit on skin  for a few minutes before rinsing. Use for 2 months if improved continue once monthly.   Return in about 1 year (around 07/12/2024) for TBSE, tinea versicolor.  IClara Crisp, CMA, am acting as scribe for Celine Collard, MD .   Documentation: I have reviewed the above documentation for accuracy and completeness, and I agree with the above.  Celine Collard, MD

## 2023-07-17 ENCOUNTER — Encounter: Payer: Self-pay | Admitting: Dermatology

## 2023-07-19 LAB — SURGICAL PATHOLOGY

## 2023-07-20 ENCOUNTER — Encounter: Payer: Self-pay | Admitting: Dermatology

## 2023-07-20 ENCOUNTER — Telehealth: Payer: Self-pay

## 2023-07-20 NOTE — Telephone Encounter (Addendum)
 Called and discussed results with patient. Verbalized understanding. Patient scheduled for deeper shave removal. ----- Message from Celine Collard sent at 07/20/2023  1:05 PM EDT ----- FINAL DIAGNOSIS        1. Skin, right lateral knee :       DYSPLASTIC COMPOUND NEVUS WITH SEVERE ATYPIA, DEEP MARGIN INVOLVED, SEE       DESCRIPTION   Severe Dysplastic Very small lesion + deep margin Recommend deeper shave removal Schedule pt for regular appt for procedure.

## 2023-07-20 NOTE — Telephone Encounter (Deleted)
-----   Message from Celine Collard sent at 07/20/2023  1:05 PM EDT ----- FINAL DIAGNOSIS        1. Skin, right lateral knee :       DYSPLASTIC COMPOUND NEVUS WITH SEVERE ATYPIA, DEEP MARGIN INVOLVED, SEE       DESCRIPTION   Severe Dysplastic Very small lesion + deep margin Recommend deeper shave removal Schedule pt for regular appt for procedure.

## 2023-07-27 ENCOUNTER — Ambulatory Visit: Payer: BC Managed Care – PPO | Admitting: Dermatology

## 2023-09-11 ENCOUNTER — Encounter: Payer: Self-pay | Admitting: Dermatology

## 2023-09-11 ENCOUNTER — Ambulatory Visit: Admitting: Dermatology

## 2023-09-11 DIAGNOSIS — W908XXA Exposure to other nonionizing radiation, initial encounter: Secondary | ICD-10-CM

## 2023-09-11 DIAGNOSIS — D2371 Other benign neoplasm of skin of right lower limb, including hip: Secondary | ICD-10-CM

## 2023-09-11 DIAGNOSIS — D492 Neoplasm of unspecified behavior of bone, soft tissue, and skin: Secondary | ICD-10-CM

## 2023-09-11 DIAGNOSIS — L578 Other skin changes due to chronic exposure to nonionizing radiation: Secondary | ICD-10-CM

## 2023-09-11 DIAGNOSIS — Z86018 Personal history of other benign neoplasm: Secondary | ICD-10-CM | POA: Diagnosis not present

## 2023-09-11 DIAGNOSIS — Z7189 Other specified counseling: Secondary | ICD-10-CM

## 2023-09-11 NOTE — Patient Instructions (Signed)

## 2023-09-11 NOTE — Progress Notes (Signed)
   Follow-Up Visit   Subjective  Lawrence Andrews is a 38 y.o. male who presents for the following: deeper shave removal for dysplastic nevus with severe atypia, deep margin involved. Right lateral knee. Bx: 07/13/2023. 0.3 cm lesion.  The patient has spots, moles and lesions to be evaluated, some may be new or changing and the patient may have concern these could be cancer.  The following portions of the chart were reviewed this encounter and updated as appropriate: medications, allergies, medical history  Review of Systems:  No other skin or systemic complaints except as noted in HPI or Assessment and Plan.  Objective  Well appearing patient in no apparent distress; mood and affect are within normal limits.  A focused examination was performed of the following areas: Right knee  Relevant physical exam findings are noted in the Assessment and Plan.  right lateral knee 0.6 cm pink biopsy site  Assessment & Plan   Hx of Severe dysplastic with + deep edge.  Small lesion. NEOPLASM OF SKIN right lateral knee Epidermal / dermal shaving  Lesion diameter (cm):  0.6 Informed consent: discussed and consent obtained   Timeout: patient name, date of birth, surgical site, and procedure verified   Procedure prep:  Patient was prepped and draped in usual sterile fashion Prep type:  Isopropyl alcohol Anesthesia: the lesion was anesthetized in a standard fashion   Anesthetic:  1% lidocaine  w/ epinephrine  1-100,000 buffered w/ 8.4% NaHCO3 Instrument used: flexible razor blade   Hemostasis achieved with: pressure, aluminum chloride and electrodesiccation   Outcome: patient tolerated procedure well   Post-procedure details: sterile dressing applied and wound care instructions given   Dressing type: bandage and petrolatum   Specimen 1 - Surgical pathology Differential Diagnosis: R/O residual severely dysplastic nevus  Check Margins: Yes  Previous pathology: [id number]   HISTORY OF  DYSPLASTIC NEVUS   ACTINIC SKIN DAMAGE   COUNSELING AND COORDINATION OF CARE    ACTINIC DAMAGE - chronic, secondary to cumulative UV radiation exposure/sun exposure over time - diffuse scaly erythematous macules with underlying dyspigmentation - Recommend daily broad spectrum sunscreen SPF 30+ to sun-exposed areas, reapply every 2 hours as needed.  - Recommend staying in the shade or wearing long sleeves, sun glasses (UVA+UVB protection) and wide brim hats (4-inch brim around the entire circumference of the hat). - Call for new or changing lesions.   Return for TBSE As Scheduled, With Dr. Hester, HxDN.  I, Jill Parcell, CMA, am acting as scribe for Alm Hester, MD.   Documentation: I have reviewed the above documentation for accuracy and completeness, and I agree with the above.  Alm Hester, MD

## 2023-09-12 LAB — SURGICAL PATHOLOGY

## 2023-09-13 ENCOUNTER — Ambulatory Visit: Payer: Self-pay | Admitting: Dermatology

## 2023-09-13 NOTE — Telephone Encounter (Addendum)
 Called and discussed results with patient. He verbalized understanding and denied further questions. Will recheck area at next follow up.  ----- Message from Alm Rhyme sent at 09/13/2023 11:20 AM EDT ----- FINAL DIAGNOSIS        1. Skin, right lateral knee :       NO RESIDUAL DYSPLASTIC NEVUS, MARGINS FREE   Site of Severe Dysplastic Margins clear Recheck next visit ----- Message ----- From: Interface, Lab In [facility] Sent: 09/12/2023   6:16 PM EDT To: Alm JAYSON Rhyme, MD

## 2023-10-13 ENCOUNTER — Encounter: Payer: Self-pay | Admitting: Emergency Medicine

## 2023-10-13 ENCOUNTER — Ambulatory Visit: Admission: EM | Admit: 2023-10-13 | Discharge: 2023-10-13 | Disposition: A | Discharge status: Home / Self Care

## 2023-10-13 DIAGNOSIS — T63441A Toxic effect of venom of bees, accidental (unintentional), initial encounter: Secondary | ICD-10-CM | POA: Diagnosis not present

## 2023-10-13 DIAGNOSIS — L509 Urticaria, unspecified: Secondary | ICD-10-CM

## 2023-10-13 MED ORDER — METHYLPREDNISOLONE ACETATE 80 MG/ML IJ SUSP
60.0000 mg | Freq: Once | INTRAMUSCULAR | Status: AC
  Administered 2023-10-13: 60 mg via INTRAMUSCULAR

## 2023-10-13 MED ORDER — PREDNISONE 20 MG PO TABS
40.0000 mg | ORAL_TABLET | Freq: Every day | ORAL | 0 refills | Status: AC
Start: 2023-10-13 — End: ?

## 2023-10-13 NOTE — ED Triage Notes (Signed)
 Patient reports getting stung by yellow jackets. Patient complains tightness, burning, redness and headache.  Rates pain 3/10. Patient took 25 mg of  Benadryl prior arrival.

## 2023-10-13 NOTE — ED Provider Notes (Signed)
 Lawrence Andrews    CSN: [id number] Arrival date & time: 10/13/23  1740      History   Chief Complaint Chief Complaint  Patient presents with   Insect Bite    HPI Lawrence Andrews is a 38 y.o. male.   Patient presents for evaluation of hives present to the bilateral lower extremities and the bilateral upper extremities, tightness to the throat and a heaviness to the chest beginning within the hour after being stung by multiple yellow jackets.   hives have intense a burning sensation and see experiencing a generalized headache rating a 3 out of 10.  Has taken 25 mg of Benadryl prior to arrival.  Past Medical History:  Diagnosis Date   Acid reflux    Allergy     Allergy  to alpha-gal    Anxiety    COVID-19 03/31/2020   Depression    Dysplastic nevus 07/13/2023   severe dysplastic - deeper shave at right lateral knee 09/11/23 -  margins clear   Hypertension     Patient Active Problem List   Diagnosis Date Noted   Neuropathy 03/16/2021   Bronchitis 03/31/2020   Acute non-recurrent sinusitis 02/13/2020   Otalgia 02/13/2020   Sinus pressure 02/13/2020   Radicular pain in right arm 01/28/2020   Chronic midline back pain 01/28/2020   Muscle spasm 12/24/2019   Allergy  to alpha-gal 10/14/2019   Other allergic rhinitis 10/14/2019   Anxiety 07/16/2019   Body mass index (BMI) of 32.0-32.9 in adult 07/16/2019   Skin yeast infection 07/16/2019   Fatigue 07/16/2019   Vitamin B12 deficiency 07/16/2019   History of panic attacks 07/16/2019   Recurrent urticaria 04/16/2019   Angioedema 04/16/2019   Epistaxis 04/16/2019   Allergic reaction 04/16/2019   Palpitations 02/27/2019   Hyperlipidemia, mixed 02/27/2019   Snoring 07/23/2018   Low testosterone 07/23/2018   Hypotestosteronemia in male 07/07/2017   Vitamin D  deficiency 07/05/2017   Tinea versicolor 07/05/2017   Chronic GERD 02/24/2017   Chewing tobacco nicotine dependence without complication 02/24/2017   Moderate  episode of recurrent major depressive disorder (HCC) 08/10/2016   Panic attacks 05/20/2016   Generalized anxiety disorder 05/20/2016   Essential hypertension 05/20/2016    Past Surgical History:  Procedure Laterality Date   ESOPHAGOGASTRODUODENOSCOPY (EGD) WITH PROPOFOL  N/A 04/27/2021   Procedure: ESOPHAGOGASTRODUODENOSCOPY (EGD) WITH PROPOFOL ;  Surgeon: Unk Corinn Skiff, MD;  Location: [facility];  Service: Endoscopy;  Laterality: N/A;   WISDOM TOOTH EXTRACTION         Home Medications    Prior to Admission medications   Medication Sig Start Date End Date Taking? Authorizing Provider  amphetamine-dextroamphetamine (ADDERALL XR) 15 MG 24 hr capsule Take 15 mg by mouth every morning. 10/09/23  Yes [provider]  amphetamine-dextroamphetamine (ADDERALL) 10 MG tablet Take 10 mg by mouth daily. 10/09/23  Yes [provider]  predniSONE  (DELTASONE ) 20 MG tablet Take 2 tablets (40 mg total) by mouth daily. 10/13/23  Yes Teresa Shelba JONELLE, NP  anastrozole (ARIMIDEX) 1 MG tablet SMARTSIG:1 Tablet(s) By Mouth 07/20/21   [provider]  clonazePAM  (KLONOPIN ) 1 MG tablet Take 1 mg by mouth daily as needed. 04/29/20   [provider]  DULoxetine (CYMBALTA) 60 MG capsule SMARTSIG:1 Capsule(s) By Mouth Every Evening 02/12/21   [provider]  ketoconazole  (NIZORAL ) 2 % shampoo Use as a body wash to affected areas 3 days a week let sit on skin for a few minutes before rinsing. Use for 2 months if  improved continue once monthly. 07/13/23   Hester Alm BROCKS, MD    Family History Family History  Problem Relation Age of Onset   Hypertension Mother    Healthy Father    Stroke Maternal Grandfather    Diabetes Paternal Grandmother     Social History Social History   Tobacco Use   Smoking status: Never   Smokeless tobacco: Former    Types: Chew    Quit date: 03/13/2021  Vaping Use   Vaping status: Never Used  Substance Use Topics   Alcohol  use: Yes    Comment: Approx 4-6 per week now (Grecia Lynk Claw, Truly) - no longer drinking daily   Drug use: No     Allergies   Alpha-gal and Other   Review of Systems Review of Systems   Physical Exam Triage Vital Signs ED Triage Vitals  Encounter Vitals Group     BP 10/13/23 1755 108/78     Girls Systolic BP Percentile --      Girls Diastolic BP Percentile --      Boys Systolic BP Percentile --      Boys Diastolic BP Percentile --      Pulse Rate 10/13/23 1755 90     Resp 10/13/23 1755 20     Temp 10/13/23 1755 98.5 F (36.9 C)     Temp Source 10/13/23 1755 Oral     SpO2 10/13/23 1755 98 %     Weight --      Height --      Head Circumference --      Peak Flow --      Pain Score 10/13/23 1759 3     Pain Loc --      Pain Education --      Exclude from Growth Chart --    No data found.  Updated Vital Signs BP 108/78 (BP Location: Right Arm)   Pulse 90   Temp 98.5 F (36.9 C) (Oral)   Resp 20   SpO2 98%   Visual Acuity Right Eye Distance:   Left Eye Distance:   Bilateral Distance:    Right Eye Near:   Left Eye Near:    Bilateral Near:     Physical Exam Constitutional:      Appearance: Normal appearance.  HENT:     Mouth/Throat:     Pharynx: No oropharyngeal exudate or posterior oropharyngeal erythema.  Eyes:     Extraocular Movements: Extraocular movements intact.  Cardiovascular:     Rate and Rhythm: Normal rate and regular rhythm.     Pulses: Normal pulses.     Heart sounds: Normal heart sounds.  Pulmonary:     Effort: Pulmonary effort is normal.     Breath sounds: Normal breath sounds.  Musculoskeletal:     Cervical back: Normal range of motion and neck supple.  Skin:    Comments: Hives present to the upper and lower extremities  Neurological:     Mental Status: He is alert and oriented to person, place, and time.      UC Treatments / Results  Labs (all labs ordered are listed, but only abnormal results are displayed) Labs Reviewed - No  data to display  EKG   Radiology No results found.  Procedures Procedures (including critical care time)  Medications Ordered in UC Medications  methylPREDNISolone acetate (DEPO-MEDROL) injection 60 mg (60 mg Intramuscular Given 10/13/23 1809)    Initial Impression / Assessment and Plan / UC Course  I have reviewed the  triage vital signs and the nursing notes.  Pertinent labs & imaging results that were available during my care of the patient were reviewed by me and considered in my medical decision making (see chart for details).   Hives, bee sting accidental, initial encounter  Present on exam, lungs clear to auscultation, O2 saturation 98% on room air, pharynx is clear without obstruction, stable for outpatient management, parenteral prednisolone given and on reevaluation after 15 minutes throat tightness and chest heaviness has resolved and hives are improving, able to be discharged home, prescribed prednisone  and recommended supportive care for pruritus advised follow-up if symptoms persist or worsen Final Clinical Impressions(s) / UC Diagnoses   Final diagnoses:  Bee sting, accidental or unintentional, initial encounter  Hives     Discharge Instructions      Today you are being treated for hives and a allergic reaction to the bee sting  You have been given an injection of steroids today in the office today to help reduce the inflammatory process that occurs with this rash which will help minimize your itching as well as begin to clear  Starting tomorrow take prednisone  every morning with food as directed, to continue the above process  You may use topical calamine or Benadryl cream to help manage itching, you may also use oral Benadryl, Claritin or zyrtec   Please avoid long exposures to heat such as a hot steamy shower or being outside as this may cause further irritation to your rash  You may follow-up with his urgent care as needed if symptoms persist or worsen     ED Prescriptions     Medication Sig Dispense Auth. Provider   predniSONE  (DELTASONE ) 20 MG tablet Take 2 tablets (40 mg total) by mouth daily. 10 tablet Xaviera Flaten R, NP      PDMP not reviewed this encounter.   Teresa Shelba SAUNDERS, [place] 10/13/23 [phone]

## 2023-10-13 NOTE — Discharge Instructions (Addendum)
 Today you are being treated for hives and a allergic reaction to the bee sting  You have been given an injection of steroids today in the office today to help reduce the inflammatory process that occurs with this rash which will help minimize your itching as well as begin to clear  Starting tomorrow take prednisone  every morning with food as directed, to continue the above process  You may use topical calamine or Benadryl cream to help manage itching, you may also use oral Benadryl, Claritin or zyrtec   Please avoid long exposures to heat such as a hot steamy shower or being outside as this may cause further irritation to your rash  You may follow-up with his urgent care as needed if symptoms persist or worsen

## 2024-07-18 ENCOUNTER — Ambulatory Visit: Admitting: Dermatology
